# Patient Record
Sex: Male | Born: 1950 | Race: White | Hispanic: No | Smoking: Never smoker
Health system: Southern US, Community
[De-identification: ages and names within clinical notes are randomized; demographics above are authoritative.]

## PROBLEM LIST (undated history)

## (undated) DIAGNOSIS — F32A Depression, unspecified: Secondary | ICD-10-CM

## (undated) DIAGNOSIS — J449 Chronic obstructive pulmonary disease, unspecified: Secondary | ICD-10-CM

## (undated) DIAGNOSIS — M4802 Spinal stenosis, cervical region: Secondary | ICD-10-CM

## (undated) DIAGNOSIS — E7800 Pure hypercholesterolemia, unspecified: Secondary | ICD-10-CM

## (undated) DIAGNOSIS — I10 Essential (primary) hypertension: Secondary | ICD-10-CM

## (undated) DIAGNOSIS — J309 Allergic rhinitis, unspecified: Secondary | ICD-10-CM

## (undated) DIAGNOSIS — F411 Generalized anxiety disorder: Secondary | ICD-10-CM

## (undated) DIAGNOSIS — M48061 Spinal stenosis, lumbar region without neurogenic claudication: Secondary | ICD-10-CM

## (undated) HISTORY — PX: REPLACEMENT TOTAL KNEE: SUR1224

## (undated) HISTORY — PX: BACK SURGERY: SHX140

---

## 1999-12-15 ENCOUNTER — Encounter: Payer: Self-pay | Admitting: Internal Medicine

## 1999-12-15 ENCOUNTER — Encounter: Admission: RE | Admit: 1999-12-15 | Discharge: 1999-12-15 | Payer: Self-pay | Admitting: Internal Medicine

## 1999-12-16 ENCOUNTER — Encounter: Admission: RE | Admit: 1999-12-16 | Discharge: 1999-12-16 | Payer: Self-pay | Admitting: Internal Medicine

## 1999-12-16 ENCOUNTER — Encounter: Payer: Self-pay | Admitting: Internal Medicine

## 2000-10-20 ENCOUNTER — Ambulatory Visit (HOSPITAL_COMMUNITY): Admission: RE | Admit: 2000-10-20 | Discharge: 2000-10-20 | Payer: Self-pay | Admitting: Neurosurgery

## 2000-11-22 ENCOUNTER — Inpatient Hospital Stay (HOSPITAL_COMMUNITY): Admission: RE | Admit: 2000-11-22 | Discharge: 2000-11-26 | Payer: Self-pay | Admitting: Neurosurgery

## 2000-12-18 ENCOUNTER — Encounter: Admission: RE | Admit: 2000-12-18 | Discharge: 2000-12-18 | Payer: Self-pay | Admitting: Neurosurgery

## 2001-06-18 ENCOUNTER — Ambulatory Visit (HOSPITAL_COMMUNITY): Admission: RE | Admit: 2001-06-18 | Discharge: 2001-06-18 | Payer: Self-pay | Admitting: Neurosurgery

## 2001-07-24 ENCOUNTER — Observation Stay (HOSPITAL_COMMUNITY): Admission: RE | Admit: 2001-07-24 | Discharge: 2001-07-25 | Payer: Self-pay | Admitting: Neurosurgery

## 2002-12-21 ENCOUNTER — Ambulatory Visit (HOSPITAL_COMMUNITY): Admission: RE | Admit: 2002-12-21 | Discharge: 2002-12-21 | Payer: Self-pay | Admitting: Neurosurgery

## 2010-02-26 ENCOUNTER — Encounter: Payer: Self-pay | Admitting: Neurosurgery

## 2017-03-26 DIAGNOSIS — M5441 Lumbago with sciatica, right side: Secondary | ICD-10-CM | POA: Diagnosis not present

## 2017-03-26 DIAGNOSIS — M549 Dorsalgia, unspecified: Secondary | ICD-10-CM | POA: Diagnosis not present

## 2017-05-30 DIAGNOSIS — Z6827 Body mass index (BMI) 27.0-27.9, adult: Secondary | ICD-10-CM | POA: Diagnosis not present

## 2017-05-30 DIAGNOSIS — J039 Acute tonsillitis, unspecified: Secondary | ICD-10-CM | POA: Diagnosis not present

## 2017-11-06 DIAGNOSIS — Z1339 Encounter for screening examination for other mental health and behavioral disorders: Secondary | ICD-10-CM | POA: Diagnosis not present

## 2017-11-06 DIAGNOSIS — Z79899 Other long term (current) drug therapy: Secondary | ICD-10-CM | POA: Diagnosis not present

## 2017-11-06 DIAGNOSIS — Z125 Encounter for screening for malignant neoplasm of prostate: Secondary | ICD-10-CM | POA: Diagnosis not present

## 2017-11-06 DIAGNOSIS — Z6828 Body mass index (BMI) 28.0-28.9, adult: Secondary | ICD-10-CM | POA: Diagnosis not present

## 2017-11-06 DIAGNOSIS — Z0000 Encounter for general adult medical examination without abnormal findings: Secondary | ICD-10-CM | POA: Diagnosis not present

## 2017-11-06 DIAGNOSIS — E782 Mixed hyperlipidemia: Secondary | ICD-10-CM | POA: Diagnosis not present

## 2017-11-06 DIAGNOSIS — Z1331 Encounter for screening for depression: Secondary | ICD-10-CM | POA: Diagnosis not present

## 2017-11-06 DIAGNOSIS — Z23 Encounter for immunization: Secondary | ICD-10-CM | POA: Diagnosis not present

## 2017-11-06 DIAGNOSIS — K2270 Barrett's esophagus without dysplasia: Secondary | ICD-10-CM | POA: Diagnosis not present

## 2017-11-07 DIAGNOSIS — Z23 Encounter for immunization: Secondary | ICD-10-CM | POA: Diagnosis not present

## 2017-11-26 DIAGNOSIS — K22719 Barrett's esophagus with dysplasia, unspecified: Secondary | ICD-10-CM | POA: Diagnosis not present

## 2017-11-26 DIAGNOSIS — K219 Gastro-esophageal reflux disease without esophagitis: Secondary | ICD-10-CM | POA: Diagnosis not present

## 2017-11-26 DIAGNOSIS — K2270 Barrett's esophagus without dysplasia: Secondary | ICD-10-CM | POA: Diagnosis not present

## 2017-12-13 DIAGNOSIS — Z79899 Other long term (current) drug therapy: Secondary | ICD-10-CM | POA: Diagnosis not present

## 2017-12-13 DIAGNOSIS — K2270 Barrett's esophagus without dysplasia: Secondary | ICD-10-CM | POA: Diagnosis not present

## 2017-12-13 DIAGNOSIS — K22719 Barrett's esophagus with dysplasia, unspecified: Secondary | ICD-10-CM | POA: Diagnosis not present

## 2017-12-13 DIAGNOSIS — K449 Diaphragmatic hernia without obstruction or gangrene: Secondary | ICD-10-CM | POA: Diagnosis not present

## 2017-12-13 DIAGNOSIS — K210 Gastro-esophageal reflux disease with esophagitis: Secondary | ICD-10-CM | POA: Diagnosis not present

## 2017-12-13 DIAGNOSIS — K229 Disease of esophagus, unspecified: Secondary | ICD-10-CM | POA: Diagnosis not present

## 2017-12-13 DIAGNOSIS — Z7982 Long term (current) use of aspirin: Secondary | ICD-10-CM | POA: Diagnosis not present

## 2017-12-13 DIAGNOSIS — Z8719 Personal history of other diseases of the digestive system: Secondary | ICD-10-CM | POA: Diagnosis not present

## 2017-12-13 DIAGNOSIS — K219 Gastro-esophageal reflux disease without esophagitis: Secondary | ICD-10-CM | POA: Diagnosis not present

## 2018-06-19 DIAGNOSIS — D509 Iron deficiency anemia, unspecified: Secondary | ICD-10-CM | POA: Diagnosis not present

## 2018-06-19 DIAGNOSIS — I10 Essential (primary) hypertension: Secondary | ICD-10-CM | POA: Diagnosis not present

## 2018-06-19 DIAGNOSIS — K219 Gastro-esophageal reflux disease without esophagitis: Secondary | ICD-10-CM | POA: Diagnosis not present

## 2018-06-19 DIAGNOSIS — E663 Overweight: Secondary | ICD-10-CM | POA: Diagnosis not present

## 2018-06-19 DIAGNOSIS — K2270 Barrett's esophagus without dysplasia: Secondary | ICD-10-CM | POA: Diagnosis not present

## 2018-06-19 DIAGNOSIS — Z6829 Body mass index (BMI) 29.0-29.9, adult: Secondary | ICD-10-CM | POA: Diagnosis not present

## 2018-06-19 DIAGNOSIS — E782 Mixed hyperlipidemia: Secondary | ICD-10-CM | POA: Diagnosis not present

## 2018-09-05 DIAGNOSIS — R0602 Shortness of breath: Secondary | ICD-10-CM | POA: Diagnosis not present

## 2018-09-05 DIAGNOSIS — I87301 Chronic venous hypertension (idiopathic) without complications of right lower extremity: Secondary | ICD-10-CM | POA: Diagnosis not present

## 2018-09-05 DIAGNOSIS — R600 Localized edema: Secondary | ICD-10-CM | POA: Diagnosis not present

## 2018-09-10 DIAGNOSIS — T7840XA Allergy, unspecified, initial encounter: Secondary | ICD-10-CM | POA: Diagnosis not present

## 2018-09-10 DIAGNOSIS — I87301 Chronic venous hypertension (idiopathic) without complications of right lower extremity: Secondary | ICD-10-CM | POA: Diagnosis not present

## 2018-09-10 DIAGNOSIS — L03115 Cellulitis of right lower limb: Secondary | ICD-10-CM | POA: Diagnosis not present

## 2018-09-10 DIAGNOSIS — Z6829 Body mass index (BMI) 29.0-29.9, adult: Secondary | ICD-10-CM | POA: Diagnosis not present

## 2018-09-17 DIAGNOSIS — Z6829 Body mass index (BMI) 29.0-29.9, adult: Secondary | ICD-10-CM | POA: Diagnosis not present

## 2018-09-17 DIAGNOSIS — L03115 Cellulitis of right lower limb: Secondary | ICD-10-CM | POA: Diagnosis not present

## 2018-09-17 DIAGNOSIS — I878 Other specified disorders of veins: Secondary | ICD-10-CM | POA: Diagnosis not present

## 2018-11-11 DIAGNOSIS — Z23 Encounter for immunization: Secondary | ICD-10-CM | POA: Diagnosis not present

## 2018-12-23 DIAGNOSIS — K22719 Barrett's esophagus with dysplasia, unspecified: Secondary | ICD-10-CM | POA: Diagnosis not present

## 2018-12-23 DIAGNOSIS — K219 Gastro-esophageal reflux disease without esophagitis: Secondary | ICD-10-CM | POA: Diagnosis not present

## 2019-01-08 DIAGNOSIS — Z0000 Encounter for general adult medical examination without abnormal findings: Secondary | ICD-10-CM | POA: Diagnosis not present

## 2019-01-08 DIAGNOSIS — I10 Essential (primary) hypertension: Secondary | ICD-10-CM | POA: Diagnosis not present

## 2019-01-08 DIAGNOSIS — E782 Mixed hyperlipidemia: Secondary | ICD-10-CM | POA: Diagnosis not present

## 2019-01-08 DIAGNOSIS — Z79899 Other long term (current) drug therapy: Secondary | ICD-10-CM | POA: Diagnosis not present

## 2019-01-08 DIAGNOSIS — Z125 Encounter for screening for malignant neoplasm of prostate: Secondary | ICD-10-CM | POA: Diagnosis not present

## 2019-01-08 DIAGNOSIS — Z23 Encounter for immunization: Secondary | ICD-10-CM | POA: Diagnosis not present

## 2019-01-08 DIAGNOSIS — R739 Hyperglycemia, unspecified: Secondary | ICD-10-CM | POA: Diagnosis not present

## 2019-01-08 DIAGNOSIS — K219 Gastro-esophageal reflux disease without esophagitis: Secondary | ICD-10-CM | POA: Diagnosis not present

## 2019-01-08 DIAGNOSIS — K2270 Barrett's esophagus without dysplasia: Secondary | ICD-10-CM | POA: Diagnosis not present

## 2019-07-22 DIAGNOSIS — Z9181 History of falling: Secondary | ICD-10-CM | POA: Diagnosis not present

## 2019-07-22 DIAGNOSIS — Z6830 Body mass index (BMI) 30.0-30.9, adult: Secondary | ICD-10-CM | POA: Diagnosis not present

## 2019-07-22 DIAGNOSIS — E782 Mixed hyperlipidemia: Secondary | ICD-10-CM | POA: Diagnosis not present

## 2019-07-22 DIAGNOSIS — J449 Chronic obstructive pulmonary disease, unspecified: Secondary | ICD-10-CM | POA: Diagnosis not present

## 2019-07-22 DIAGNOSIS — K219 Gastro-esophageal reflux disease without esophagitis: Secondary | ICD-10-CM | POA: Diagnosis not present

## 2019-07-22 DIAGNOSIS — E669 Obesity, unspecified: Secondary | ICD-10-CM | POA: Diagnosis not present

## 2019-07-22 DIAGNOSIS — I10 Essential (primary) hypertension: Secondary | ICD-10-CM | POA: Diagnosis not present

## 2019-07-22 DIAGNOSIS — K2270 Barrett's esophagus without dysplasia: Secondary | ICD-10-CM | POA: Diagnosis not present

## 2019-07-22 DIAGNOSIS — Z1331 Encounter for screening for depression: Secondary | ICD-10-CM | POA: Diagnosis not present

## 2019-11-25 DIAGNOSIS — Z23 Encounter for immunization: Secondary | ICD-10-CM | POA: Diagnosis not present

## 2019-12-22 DIAGNOSIS — K2270 Barrett's esophagus without dysplasia: Secondary | ICD-10-CM | POA: Diagnosis not present

## 2019-12-22 DIAGNOSIS — K219 Gastro-esophageal reflux disease without esophagitis: Secondary | ICD-10-CM | POA: Diagnosis not present

## 2020-01-13 DIAGNOSIS — R918 Other nonspecific abnormal finding of lung field: Secondary | ICD-10-CM | POA: Diagnosis not present

## 2020-01-13 DIAGNOSIS — J418 Mixed simple and mucopurulent chronic bronchitis: Secondary | ICD-10-CM | POA: Diagnosis not present

## 2020-01-13 DIAGNOSIS — E782 Mixed hyperlipidemia: Secondary | ICD-10-CM | POA: Diagnosis not present

## 2020-01-13 DIAGNOSIS — Z122 Encounter for screening for malignant neoplasm of respiratory organs: Secondary | ICD-10-CM | POA: Diagnosis not present

## 2020-01-13 DIAGNOSIS — Z79899 Other long term (current) drug therapy: Secondary | ICD-10-CM | POA: Diagnosis not present

## 2020-01-13 DIAGNOSIS — Z0000 Encounter for general adult medical examination without abnormal findings: Secondary | ICD-10-CM | POA: Diagnosis not present

## 2020-01-13 DIAGNOSIS — R7302 Impaired glucose tolerance (oral): Secondary | ICD-10-CM | POA: Diagnosis not present

## 2020-01-13 DIAGNOSIS — Z125 Encounter for screening for malignant neoplasm of prostate: Secondary | ICD-10-CM | POA: Diagnosis not present

## 2020-01-13 DIAGNOSIS — J3089 Other allergic rhinitis: Secondary | ICD-10-CM | POA: Diagnosis not present

## 2020-01-19 DIAGNOSIS — R918 Other nonspecific abnormal finding of lung field: Secondary | ICD-10-CM | POA: Diagnosis not present

## 2020-01-19 DIAGNOSIS — R911 Solitary pulmonary nodule: Secondary | ICD-10-CM | POA: Diagnosis not present

## 2020-01-19 DIAGNOSIS — J984 Other disorders of lung: Secondary | ICD-10-CM | POA: Diagnosis not present

## 2020-01-19 DIAGNOSIS — R062 Wheezing: Secondary | ICD-10-CM | POA: Diagnosis not present

## 2020-01-19 DIAGNOSIS — I700 Atherosclerosis of aorta: Secondary | ICD-10-CM | POA: Diagnosis not present

## 2020-09-08 DIAGNOSIS — K2100 Gastro-esophageal reflux disease with esophagitis, without bleeding: Secondary | ICD-10-CM | POA: Diagnosis not present

## 2020-09-08 DIAGNOSIS — K2270 Barrett's esophagus without dysplasia: Secondary | ICD-10-CM | POA: Diagnosis not present

## 2020-09-08 DIAGNOSIS — E782 Mixed hyperlipidemia: Secondary | ICD-10-CM | POA: Diagnosis not present

## 2020-09-08 DIAGNOSIS — E669 Obesity, unspecified: Secondary | ICD-10-CM | POA: Diagnosis not present

## 2020-09-08 DIAGNOSIS — R918 Other nonspecific abnormal finding of lung field: Secondary | ICD-10-CM | POA: Diagnosis not present

## 2020-09-08 DIAGNOSIS — I10 Essential (primary) hypertension: Secondary | ICD-10-CM | POA: Diagnosis not present

## 2020-09-08 DIAGNOSIS — Z6830 Body mass index (BMI) 30.0-30.9, adult: Secondary | ICD-10-CM | POA: Diagnosis not present

## 2020-09-08 DIAGNOSIS — I700 Atherosclerosis of aorta: Secondary | ICD-10-CM | POA: Diagnosis not present

## 2020-09-08 DIAGNOSIS — J418 Mixed simple and mucopurulent chronic bronchitis: Secondary | ICD-10-CM | POA: Diagnosis not present

## 2020-11-17 DIAGNOSIS — Z23 Encounter for immunization: Secondary | ICD-10-CM | POA: Diagnosis not present

## 2020-11-23 DIAGNOSIS — R69 Illness, unspecified: Secondary | ICD-10-CM | POA: Diagnosis not present

## 2020-11-30 DIAGNOSIS — M545 Low back pain, unspecified: Secondary | ICD-10-CM | POA: Diagnosis not present

## 2020-11-30 DIAGNOSIS — G8929 Other chronic pain: Secondary | ICD-10-CM | POA: Diagnosis not present

## 2020-11-30 DIAGNOSIS — Z6830 Body mass index (BMI) 30.0-30.9, adult: Secondary | ICD-10-CM | POA: Diagnosis not present

## 2021-02-10 DIAGNOSIS — K219 Gastro-esophageal reflux disease without esophagitis: Secondary | ICD-10-CM | POA: Diagnosis not present

## 2021-02-10 DIAGNOSIS — K2270 Barrett's esophagus without dysplasia: Secondary | ICD-10-CM | POA: Diagnosis not present

## 2021-02-16 DIAGNOSIS — Z79899 Other long term (current) drug therapy: Secondary | ICD-10-CM | POA: Diagnosis not present

## 2021-02-16 DIAGNOSIS — R739 Hyperglycemia, unspecified: Secondary | ICD-10-CM | POA: Diagnosis not present

## 2021-02-16 DIAGNOSIS — E782 Mixed hyperlipidemia: Secondary | ICD-10-CM | POA: Diagnosis not present

## 2021-02-16 DIAGNOSIS — Z125 Encounter for screening for malignant neoplasm of prostate: Secondary | ICD-10-CM | POA: Diagnosis not present

## 2021-02-23 DIAGNOSIS — K2270 Barrett's esophagus without dysplasia: Secondary | ICD-10-CM | POA: Diagnosis not present

## 2021-02-23 DIAGNOSIS — E782 Mixed hyperlipidemia: Secondary | ICD-10-CM | POA: Diagnosis not present

## 2021-02-23 DIAGNOSIS — I700 Atherosclerosis of aorta: Secondary | ICD-10-CM | POA: Diagnosis not present

## 2021-02-23 DIAGNOSIS — I10 Essential (primary) hypertension: Secondary | ICD-10-CM | POA: Diagnosis not present

## 2021-02-23 DIAGNOSIS — R918 Other nonspecific abnormal finding of lung field: Secondary | ICD-10-CM | POA: Diagnosis not present

## 2021-02-23 DIAGNOSIS — J418 Mixed simple and mucopurulent chronic bronchitis: Secondary | ICD-10-CM | POA: Diagnosis not present

## 2021-02-23 DIAGNOSIS — M4726 Other spondylosis with radiculopathy, lumbar region: Secondary | ICD-10-CM | POA: Diagnosis not present

## 2021-02-23 DIAGNOSIS — K2100 Gastro-esophageal reflux disease with esophagitis, without bleeding: Secondary | ICD-10-CM | POA: Diagnosis not present

## 2021-02-23 DIAGNOSIS — Z0000 Encounter for general adult medical examination without abnormal findings: Secondary | ICD-10-CM | POA: Diagnosis not present

## 2021-03-01 DIAGNOSIS — K229 Disease of esophagus, unspecified: Secondary | ICD-10-CM | POA: Diagnosis not present

## 2021-03-01 DIAGNOSIS — K2270 Barrett's esophagus without dysplasia: Secondary | ICD-10-CM | POA: Diagnosis not present

## 2021-03-01 DIAGNOSIS — K449 Diaphragmatic hernia without obstruction or gangrene: Secondary | ICD-10-CM | POA: Diagnosis not present

## 2021-03-01 DIAGNOSIS — K219 Gastro-esophageal reflux disease without esophagitis: Secondary | ICD-10-CM | POA: Diagnosis not present

## 2021-04-26 DIAGNOSIS — R531 Weakness: Secondary | ICD-10-CM | POA: Diagnosis not present

## 2021-04-26 DIAGNOSIS — M79605 Pain in left leg: Secondary | ICD-10-CM | POA: Diagnosis not present

## 2021-04-26 DIAGNOSIS — W19XXXA Unspecified fall, initial encounter: Secondary | ICD-10-CM | POA: Diagnosis not present

## 2021-04-26 DIAGNOSIS — Z043 Encounter for examination and observation following other accident: Secondary | ICD-10-CM | POA: Diagnosis not present

## 2021-04-26 DIAGNOSIS — Z87891 Personal history of nicotine dependence: Secondary | ICD-10-CM | POA: Diagnosis not present

## 2021-04-26 DIAGNOSIS — G8929 Other chronic pain: Secondary | ICD-10-CM | POA: Diagnosis not present

## 2021-04-26 DIAGNOSIS — M542 Cervicalgia: Secondary | ICD-10-CM | POA: Diagnosis not present

## 2021-04-26 DIAGNOSIS — S0990XA Unspecified injury of head, initial encounter: Secondary | ICD-10-CM | POA: Diagnosis not present

## 2021-04-26 DIAGNOSIS — I10 Essential (primary) hypertension: Secondary | ICD-10-CM | POA: Diagnosis not present

## 2021-04-26 DIAGNOSIS — S46912A Strain of unspecified muscle, fascia and tendon at shoulder and upper arm level, left arm, initial encounter: Secondary | ICD-10-CM | POA: Diagnosis not present

## 2021-04-27 DIAGNOSIS — M5459 Other low back pain: Secondary | ICD-10-CM | POA: Diagnosis not present

## 2021-04-27 DIAGNOSIS — M542 Cervicalgia: Secondary | ICD-10-CM | POA: Diagnosis not present

## 2021-04-27 DIAGNOSIS — M5412 Radiculopathy, cervical region: Secondary | ICD-10-CM | POA: Diagnosis not present

## 2021-04-27 DIAGNOSIS — M4316 Spondylolisthesis, lumbar region: Secondary | ICD-10-CM | POA: Diagnosis not present

## 2021-04-27 DIAGNOSIS — M47816 Spondylosis without myelopathy or radiculopathy, lumbar region: Secondary | ICD-10-CM | POA: Diagnosis not present

## 2021-04-27 DIAGNOSIS — M546 Pain in thoracic spine: Secondary | ICD-10-CM | POA: Diagnosis not present

## 2021-04-27 DIAGNOSIS — M5416 Radiculopathy, lumbar region: Secondary | ICD-10-CM | POA: Diagnosis not present

## 2021-04-27 DIAGNOSIS — Z4789 Encounter for other orthopedic aftercare: Secondary | ICD-10-CM | POA: Diagnosis not present

## 2021-04-27 DIAGNOSIS — M47814 Spondylosis without myelopathy or radiculopathy, thoracic region: Secondary | ICD-10-CM | POA: Diagnosis not present

## 2021-04-27 DIAGNOSIS — Z981 Arthrodesis status: Secondary | ICD-10-CM | POA: Diagnosis not present

## 2021-04-27 DIAGNOSIS — M5134 Other intervertebral disc degeneration, thoracic region: Secondary | ICD-10-CM | POA: Diagnosis not present

## 2021-04-27 DIAGNOSIS — M549 Dorsalgia, unspecified: Secondary | ICD-10-CM | POA: Diagnosis not present

## 2021-04-28 ENCOUNTER — Other Ambulatory Visit: Payer: Self-pay

## 2021-04-28 ENCOUNTER — Encounter (HOSPITAL_COMMUNITY): Payer: Self-pay

## 2021-04-28 ENCOUNTER — Emergency Department (HOSPITAL_COMMUNITY): Payer: Medicare HMO

## 2021-04-28 ENCOUNTER — Inpatient Hospital Stay (HOSPITAL_COMMUNITY): Payer: Medicare HMO

## 2021-04-28 ENCOUNTER — Inpatient Hospital Stay (HOSPITAL_COMMUNITY)
Admission: EM | Admit: 2021-04-28 | Discharge: 2021-05-02 | DRG: 029 | Disposition: A | Discharge status: Home Health | Payer: Medicare HMO | Attending: Internal Medicine | Admitting: Internal Medicine

## 2021-04-28 DIAGNOSIS — Z888 Allergy status to other drugs, medicaments and biological substances status: Secondary | ICD-10-CM

## 2021-04-28 DIAGNOSIS — I129 Hypertensive chronic kidney disease with stage 1 through stage 4 chronic kidney disease, or unspecified chronic kidney disease: Secondary | ICD-10-CM | POA: Diagnosis not present

## 2021-04-28 DIAGNOSIS — Y929 Unspecified place or not applicable: Secondary | ICD-10-CM | POA: Diagnosis not present

## 2021-04-28 DIAGNOSIS — I6782 Cerebral ischemia: Secondary | ICD-10-CM | POA: Diagnosis not present

## 2021-04-28 DIAGNOSIS — Z7982 Long term (current) use of aspirin: Secondary | ICD-10-CM

## 2021-04-28 DIAGNOSIS — G542 Cervical root disorders, not elsewhere classified: Principal | ICD-10-CM | POA: Diagnosis present

## 2021-04-28 DIAGNOSIS — J309 Allergic rhinitis, unspecified: Secondary | ICD-10-CM | POA: Diagnosis present

## 2021-04-28 DIAGNOSIS — Z882 Allergy status to sulfonamides status: Secondary | ICD-10-CM

## 2021-04-28 DIAGNOSIS — W19XXXA Unspecified fall, initial encounter: Secondary | ICD-10-CM | POA: Diagnosis not present

## 2021-04-28 DIAGNOSIS — M4322 Fusion of spine, cervical region: Secondary | ICD-10-CM | POA: Diagnosis not present

## 2021-04-28 DIAGNOSIS — D72829 Elevated white blood cell count, unspecified: Secondary | ICD-10-CM | POA: Diagnosis present

## 2021-04-28 DIAGNOSIS — M545 Low back pain, unspecified: Secondary | ICD-10-CM | POA: Diagnosis not present

## 2021-04-28 DIAGNOSIS — W010XXA Fall on same level from slipping, tripping and stumbling without subsequent striking against object, initial encounter: Secondary | ICD-10-CM | POA: Diagnosis present

## 2021-04-28 DIAGNOSIS — N179 Acute kidney failure, unspecified: Secondary | ICD-10-CM | POA: Diagnosis present

## 2021-04-28 DIAGNOSIS — I10 Essential (primary) hypertension: Secondary | ICD-10-CM | POA: Diagnosis present

## 2021-04-28 DIAGNOSIS — T380X5A Adverse effect of glucocorticoids and synthetic analogues, initial encounter: Secondary | ICD-10-CM | POA: Diagnosis present

## 2021-04-28 DIAGNOSIS — G992 Myelopathy in diseases classified elsewhere: Secondary | ICD-10-CM | POA: Diagnosis present

## 2021-04-28 DIAGNOSIS — Z96651 Presence of right artificial knee joint: Secondary | ICD-10-CM | POA: Diagnosis present

## 2021-04-28 DIAGNOSIS — N1832 Chronic kidney disease, stage 3b: Secondary | ICD-10-CM | POA: Diagnosis not present

## 2021-04-28 DIAGNOSIS — R531 Weakness: Secondary | ICD-10-CM

## 2021-04-28 DIAGNOSIS — Z880 Allergy status to penicillin: Secondary | ICD-10-CM

## 2021-04-28 DIAGNOSIS — Y92007 Garden or yard of unspecified non-institutional (private) residence as the place of occurrence of the external cause: Secondary | ICD-10-CM | POA: Diagnosis not present

## 2021-04-28 DIAGNOSIS — G319 Degenerative disease of nervous system, unspecified: Secondary | ICD-10-CM | POA: Diagnosis not present

## 2021-04-28 DIAGNOSIS — G959 Disease of spinal cord, unspecified: Secondary | ICD-10-CM | POA: Diagnosis not present

## 2021-04-28 DIAGNOSIS — G8929 Other chronic pain: Secondary | ICD-10-CM | POA: Diagnosis present

## 2021-04-28 DIAGNOSIS — F411 Generalized anxiety disorder: Secondary | ICD-10-CM | POA: Diagnosis present

## 2021-04-28 DIAGNOSIS — E041 Nontoxic single thyroid nodule: Secondary | ICD-10-CM | POA: Diagnosis not present

## 2021-04-28 DIAGNOSIS — S199XXA Unspecified injury of neck, initial encounter: Secondary | ICD-10-CM | POA: Diagnosis not present

## 2021-04-28 DIAGNOSIS — M5001 Cervical disc disorder with myelopathy,  high cervical region: Secondary | ICD-10-CM | POA: Diagnosis not present

## 2021-04-28 DIAGNOSIS — R29818 Other symptoms and signs involving the nervous system: Secondary | ICD-10-CM | POA: Diagnosis not present

## 2021-04-28 DIAGNOSIS — J329 Chronic sinusitis, unspecified: Secondary | ICD-10-CM | POA: Diagnosis not present

## 2021-04-28 DIAGNOSIS — Z79899 Other long term (current) drug therapy: Secondary | ICD-10-CM

## 2021-04-28 DIAGNOSIS — Z7952 Long term (current) use of systemic steroids: Secondary | ICD-10-CM

## 2021-04-28 DIAGNOSIS — S0990XA Unspecified injury of head, initial encounter: Secondary | ICD-10-CM | POA: Diagnosis not present

## 2021-04-28 DIAGNOSIS — R7989 Other specified abnormal findings of blood chemistry: Secondary | ICD-10-CM | POA: Diagnosis present

## 2021-04-28 DIAGNOSIS — M549 Dorsalgia, unspecified: Secondary | ICD-10-CM | POA: Diagnosis not present

## 2021-04-28 DIAGNOSIS — Z981 Arthrodesis status: Secondary | ICD-10-CM | POA: Diagnosis not present

## 2021-04-28 DIAGNOSIS — N281 Cyst of kidney, acquired: Secondary | ICD-10-CM | POA: Diagnosis not present

## 2021-04-28 DIAGNOSIS — K219 Gastro-esophageal reflux disease without esophagitis: Secondary | ICD-10-CM | POA: Diagnosis not present

## 2021-04-28 DIAGNOSIS — M4802 Spinal stenosis, cervical region: Secondary | ICD-10-CM | POA: Diagnosis not present

## 2021-04-28 DIAGNOSIS — G9520 Unspecified cord compression: Secondary | ICD-10-CM | POA: Diagnosis not present

## 2021-04-28 DIAGNOSIS — E7800 Pure hypercholesterolemia, unspecified: Secondary | ICD-10-CM | POA: Diagnosis present

## 2021-04-28 DIAGNOSIS — M47812 Spondylosis without myelopathy or radiculopathy, cervical region: Secondary | ICD-10-CM | POA: Diagnosis not present

## 2021-04-28 DIAGNOSIS — I459 Conduction disorder, unspecified: Secondary | ICD-10-CM | POA: Diagnosis present

## 2021-04-28 DIAGNOSIS — N189 Chronic kidney disease, unspecified: Secondary | ICD-10-CM | POA: Diagnosis not present

## 2021-04-28 DIAGNOSIS — E871 Hypo-osmolality and hyponatremia: Secondary | ICD-10-CM | POA: Diagnosis present

## 2021-04-28 HISTORY — DX: Essential (primary) hypertension: I10

## 2021-04-28 HISTORY — DX: Spinal stenosis, lumbar region without neurogenic claudication: M48.061

## 2021-04-28 HISTORY — DX: Spinal stenosis, cervical region: M48.02

## 2021-04-28 HISTORY — DX: Depression, unspecified: F32.A

## 2021-04-28 HISTORY — DX: Generalized anxiety disorder: F41.1

## 2021-04-28 HISTORY — DX: Allergic rhinitis, unspecified: J30.9

## 2021-04-28 HISTORY — DX: Chronic obstructive pulmonary disease, unspecified: J44.9

## 2021-04-28 HISTORY — DX: Pure hypercholesterolemia, unspecified: E78.00

## 2021-04-28 LAB — URINALYSIS, COMPLETE (UACMP) WITH MICROSCOPIC
Bacteria, UA: NONE SEEN
Bilirubin Urine: NEGATIVE
Glucose, UA: NEGATIVE mg/dL
Hgb urine dipstick: NEGATIVE
Ketones, ur: NEGATIVE mg/dL
Leukocytes,Ua: NEGATIVE
Nitrite: NEGATIVE
Protein, ur: NEGATIVE mg/dL
Specific Gravity, Urine: 1.011 (ref 1.005–1.030)
pH: 5 (ref 5.0–8.0)

## 2021-04-28 LAB — COMPREHENSIVE METABOLIC PANEL
ALT: 30 U/L (ref 0–44)
AST: 40 U/L (ref 15–41)
Albumin: 3.9 g/dL (ref 3.5–5.0)
Alkaline Phosphatase: 42 U/L (ref 38–126)
Anion gap: 13 (ref 5–15)
BUN: 23 mg/dL (ref 8–23)
CO2: 18 mmol/L — ABNORMAL LOW (ref 22–32)
Calcium: 9.5 mg/dL (ref 8.9–10.3)
Chloride: 94 mmol/L — ABNORMAL LOW (ref 98–111)
Creatinine, Ser: 1.35 mg/dL — ABNORMAL HIGH (ref 0.61–1.24)
GFR, Estimated: 56 mL/min — ABNORMAL LOW (ref >60)
Glucose, Bld: 138 mg/dL — ABNORMAL HIGH (ref 70–99)
Potassium: 4.5 mmol/L (ref 3.5–5.1)
Sodium: 125 mmol/L — ABNORMAL LOW (ref 135–145)
Total Bilirubin: 0.8 mg/dL (ref 0.3–1.2)
Total Protein: 6.3 g/dL — ABNORMAL LOW (ref 6.5–8.1)

## 2021-04-28 LAB — CBC
HCT: 42.3 % (ref 39.0–52.0)
Hemoglobin: 14.4 g/dL (ref 13.0–17.0)
MCH: 31.1 pg (ref 26.0–34.0)
MCHC: 34 g/dL (ref 30.0–36.0)
MCV: 91.4 fL (ref 80.0–100.0)
Platelets: 220 10*3/uL (ref 150–400)
RBC: 4.63 MIL/uL (ref 4.22–5.81)
RDW: 12.8 % (ref 11.5–15.5)
WBC: 13.5 10*3/uL — ABNORMAL HIGH (ref 4.0–10.5)
nRBC: 0 % (ref 0.0–0.2)

## 2021-04-28 LAB — CREATININE, URINE, RANDOM: Creatinine, Urine: 79.21 mg/dL

## 2021-04-28 LAB — SODIUM, URINE, RANDOM: Sodium, Ur: 51 mmol/L

## 2021-04-28 LAB — OSMOLALITY, URINE: Osmolality, Ur: 362 mOsm/kg (ref 300–900)

## 2021-04-28 MED ORDER — SODIUM CHLORIDE 0.9 % IV BOLUS: 1000.0000 mL | Freq: Once | INTRAVENOUS | Status: DC

## 2021-04-28 MED ORDER — SODIUM CHLORIDE 0.9 % IV BOLUS
1000.0000 mL | Freq: Once | INTRAVENOUS | Status: AC
  Administered 2021-04-28: 1000 mL via INTRAVENOUS

## 2021-04-28 MED ORDER — ACETAMINOPHEN 650 MG RE SUPP: 650.0000 mg | Freq: Four times a day (QID) | RECTAL | Status: DC | PRN

## 2021-04-28 MED ORDER — LACTATED RINGERS IV SOLN
INTRAVENOUS | Status: AC
  Administered 2021-04-28: 50 mL/h via INTRAVENOUS

## 2021-04-28 MED ORDER — PANTOPRAZOLE SODIUM 40 MG PO TBEC
40.0000 mg | DELAYED_RELEASE_TABLET | Freq: Every day | ORAL | Status: DC
Start: 2021-04-29 — End: 2021-05-02
  Administered 2021-05-01 – 2021-05-02 (×2): 40 mg via ORAL
  Filled 2021-04-28 (×2): qty 1

## 2021-04-28 MED ORDER — ASPIRIN EC 325 MG PO TBEC
325.0000 mg | DELAYED_RELEASE_TABLET | Freq: Every day | ORAL | Status: DC
  Administered 2021-05-01 – 2021-05-02 (×2): 325 mg via ORAL
  Filled 2021-04-28 (×2): qty 1

## 2021-04-28 MED ORDER — SIMVASTATIN 20 MG PO TABS
20.0000 mg | ORAL_TABLET | Freq: Every evening | ORAL | Status: DC
  Administered 2021-04-30 – 2021-05-01 (×2): 20 mg via ORAL
  Filled 2021-04-28 (×2): qty 1

## 2021-04-28 MED ORDER — ACETAMINOPHEN 325 MG PO TABS: 650.0000 mg | ORAL_TABLET | Freq: Four times a day (QID) | ORAL | Status: DC | PRN

## 2021-04-28 MED ORDER — BISOPROLOL FUMARATE 10 MG PO TABS
10.0000 mg | ORAL_TABLET | Freq: Every day | ORAL | Status: DC
Start: 2021-04-29 — End: 2021-05-02
  Administered 2021-05-01 – 2021-05-02 (×2): 10 mg via ORAL
  Filled 2021-04-28 (×4): qty 1

## 2021-04-28 MED ORDER — HYDROCODONE-ACETAMINOPHEN 5-325 MG PO TABS
1.0000 | ORAL_TABLET | Freq: Four times a day (QID) | ORAL | Status: DC | PRN
  Administered 2021-04-30 – 2021-05-01 (×4): 1 via ORAL
  Filled 2021-04-28 (×4): qty 1

## 2021-04-28 NOTE — Assessment & Plan Note (Signed)
#)   Generalized weakness: Approximately 10-day duration of generalized weakness.  Subjectively, the patient also reports that his breathing is slightly more pronounced in the left upper and lower extremities relative to the right, although without overt to objective findings on exam to collaborate this.  CT head as well as MRI brain showed no evidence of acute intracranial process, including evidence of intracranial hemorrhage or acute ischemic infarct.  Neurology consulted and conveys that cervical cord compression is less likely, but also recommend pursuit of MRI cervical spine to further evaluate.  This imaging has been ordered, with result pending.  Preceding CT cervical spine results discussed above, in the context of a known history of cervical spinal stenosis status post cervical fusion of C4-C5 as well as C5-C6, as further detailed above. ?  ?In terms of additional contributing factors leading to presenting generalized weakness, suspect element of dehydration, leading to presumably acute hyponatremia.  No evidence of underlying infectious process at this time, although will pursue urinalysis as well as chest x-ray to further evaluate. In setting of recent fall associated with generalized weakness, will also check CPK level. ?  ?  ?  ?Plan: work-up and management of presenting hyponatremia, as described above. PT/OT consults ordered for the AM. Fall precautions. Check TSH, MMA. CMP/CBC in the AM.  Check CPK level, as above.  Check urinalysis and chest x-ray. ?  ?

## 2021-04-28 NOTE — Assessment & Plan Note (Signed)
 #)   Hyperlipidemia: documented h/o such. On simvastatin as outpatient.    Plan: continue home statin.    

## 2021-04-28 NOTE — Assessment & Plan Note (Signed)
#)  Leukocytosis: Presenting CBC reflects mildly elevated white cell count of 13,500. Suspect an element of hemoconcentration in the setting of evidence of dehydration, including via the patient's report of recent decline in oral intake. This may also be reactive in nature in the setting of report of recent ground-level mechanical fall, as further detailed above.. No evidence to suggest underlying infectious process at this time, but with plan to pursue urinalysis and chest x-ray to further evaluate for underlying evidence of such.  No additional SIRS criteria met at this time. Overall, criteria for sepsis are not currently met. Appears hemodynamically stable.  Therefore, will refrain from initiation of antibiotics at this time. ?  ?  ?Plan: Repeat CBC with diff in the morning.  Monitor strict I's and O's, daily weights.  Check urinalysis, chest x-ray.  Gentle IV fluids, as above.  Repeat CMP in the morning.  Request records from the [place], including to evaluate most recent prior lipid cell count. ?  ?  ?  ?  ?

## 2021-04-28 NOTE — H&P (Signed)
?History and Physical  ? ? ?PLEASE NOTE THAT [employer]AG[employer] SOFTWARE WAS USED IN THE CONSTRUCTION OF THIS NOTE. ? ? ?Gabriel Stein [id number]:[id number] DOB: 08/25/1950 DOA: 04/28/2021 ? ?PCP: Pcp, No (goes to the [place]) ?Patient coming from: home  ? ?I have personally briefly reviewed patient's old medical records in [facility] ? ?Chief Complaint: Weakness ? ?HPI: Gabriel Stein is a 71 y.o. male with medical history significant for hypertension, hyperlipidemia, spinal stenosis of the cervical spine status post cervical fusion C4-C5 as well as C5-C6, spinal stenosis of the lumbar spine status post L4-L5 fusion, who is admitted to [facility] on 04/28/2021 with acute hyponatremia after presenting from home to [facility] ED complaining of weakness.  ? ?The patient reports approximately 10 days of generalized weakness, resulting in a ground-level mechanical fall 1 week ago, in which the patient tripped ambulating at home.  He does not believe that he hit his head as component of this fall, and notes that the sequence was not associate with any loss of consciousness.  While he continues to note generalized weakness, he also reports that his weakness seems to be more pronounced on the left side, involving left upper and lower extremities relative to the right.  He first noticed this potential asymmetric weakness superimposed on generalized weakness following the aforementioned ground-level mechanical fall 1 week ago. Not associated with any acute focal numbness, paresthesias, facial droop, slurred speech, expressive aphasia, acute change in vision, dysphagia, vertigo. ? ?After this, mechanical fall, he also notes mild thoracic back discomfort as well as mild lumbar back pain, prompting him to present to urgent care yesterday, at which time plain films of the lumbar spine demonstrated evidence of L4-L5 vertebral body fusion, without any detected hardware complication, and no evidence of acute compression fracture.  These  plain films also showed trace anterior listhesis of L3 on L4, as well as degenerative disc changes at L3-L4 and L5-S1, with mild degenerative changes of the facet joints.  Plain films of the thoracic spine performed at that time showed mild thoracic spondylosis, without evidence of compression fracture.  ? ?In the setting of perpetuation of his generalized weakness, the patient presents to [facility] emergency department this evening for further evaluation and management thereof.  ? ?Denies any recent subjective fever, chills, rigors, or generalized myalgias. No recent headache, neck stiffness, rhinitis, rhinorrhea, sore throat, abdominal pain, diarrhea, or rash. No known recent COVID-19 exposures. Denies dysuria, gross hematuria, or change in urinary urgency/frequency.  No recent chest pain, shortness of breath, cough, palpitations, diaphoresis .he reports recent potential decline in oral intake, but notes no associated nausea/vomiting.  Denies any additional falls, trauma, injury subsequent to the ground-level mechanical fall that occurred 1 week ago. ? ?Medical history notable for essential pretension, and hypertensive regimen notable for chlorthalidone. ? ?He receives the majority of his care at the [place], with initial chart review revealing no serum sodium or creatinine levels dating back to 2002. ? ? ? ? ?ED Course:  ?Vital signs in the ED were notable for the following: Afebrile; heart rate 63-78; blood pressure 102/57 - 114/63; respiratory rate 16-21, oxygen saturation 94 to 97% on room air. ? ?Labs were notable for the following: CMP notable for the following: Sodium 125, without prior serum sodium data points available for point comparison, chloride 94, creatinine 1.35, without prior serum creatinine two-point available for point comparison, glucose 133, and liver enzymes were noted to be within normal limits.  CBC notable for  the following: Episode count 13,500. ? ?Imaging and additional notable ED work-up:  EKG notable for sinus rhythm with heart rate 60, interventricular conduction delay, no evidence of T wave or ST changes, including no evidence of ST elevation.  Noncontrast CT of the head showed no evidence of acute intracranial process, including no evidence of intracranial bleed or acute ischemic infarct.   ? ?In evaluating patient's report left-sided weakness over the course the last week, CT cervical spine showed no evidence of acute fracture, will demonstrate previous surgical fusion at C4-C5 as well as previous surgical fusion at C5-C6, while showing spinal stenosis at C3-C4 and C6-C7 levels, will demonstrating encroachment of neural foramina from C3-T1. ? ?Additionally, MRI of the brain showed no evidence of acute process, including no evidence of acute ischemic infarct. ? ?ED discussed the patient's case with the on-call neurologist, Dr. Otelia LimesLindzen, Who felt that cervical cord compression was unlikely, recommended proceeding to have MRI of the cervical spine to further evaluate.   ? ?While in the ED, the following were administered: Normal saline x1 L bolus ? ?Subsequently, the patient was admitted for further evaluation management to suspected acute hyponatremia in the setting of presenting generalized weakness, with subjective left-sided weakness, with presenting labs also notable for mild leukocytosis.   ? ? ? ? ? ?Review of Systems: As per HPI otherwise 10 point review of systems negative.  ? ?Past Medical History:  ?Diagnosis Date  ? Allergic rhinitis   ? Cervical stenosis of spine   ? Depression   ? GAD (generalized anxiety disorder)   ? Hypercholesteremia   ? Hypertension   ? Lumbar stenosis   ? ? ?Past Surgical History:  ?Procedure Laterality Date  ? BACK SURGERY    ? REPLACEMENT TOTAL KNEE Right   ? ? ?Social History: ? reports that he has never smoked. He has never used smokeless tobacco. He reports that he does not currently use alcohol. He reports that he does not use drugs. ? ? ?Allergies   ?Allergen Reactions  ? Clarithromycin Hives, Rash and Other (See Comments)  ?  Welts, also ?  ? Penicillins Hives and Rash  ?  Welts, also ?  ? Gabapentin Other (See Comments)  ?  Drowsiness and urinary incontinence ?  ? Sulfa Antibiotics Other (See Comments)  ?  Patient said his MD told him he was allergic, but the exact reaction was not recalled  ? Venlafaxine Other (See Comments)  ?  Reaction not recalled   ? ? ?History reviewed. No pertinent family history. ? ?Family history reviewed and not pertinent  ? ? ?Prior to Admission medications   ?Medication Sig Start Date End Date Taking? Authorizing Provider  ?albuterol (VENTOLIN HFA) 108 (90 Base) MCG/ACT inhaler Inhale 2 puffs into the lungs 3 (three) times daily.   Yes [provider]  ?aspirin 325 MG EC tablet Take 325 mg by mouth in the morning.   Yes [provider]  ?bisoprolol (ZEBETA) 10 MG tablet Take 10 mg by mouth in the morning.   Yes [provider]  ?chlorthalidone (HYGROTON) 25 MG tablet Take 25 mg by mouth in the morning.   Yes [provider]  ?felodipine (PLENDIL) 5 MG 24 hr tablet Take 5 mg by mouth in the morning.   Yes [provider]  ?lisinopril (ZESTRIL) 40 MG tablet Take 40 mg by mouth in the morning.   Yes [provider]  ?omeprazole (PRILOSEC) 40 MG capsule Take 40 mg by mouth 2 (  two) times daily.   Yes [provider]  ?predniSONE (DELTASONE) 20 MG tablet Take 40 mg by mouth daily with breakfast. 04/27/21 05/02/21 Yes [provider]  ?simvastatin (ZOCOR) 20 MG tablet Take 20 mg by mouth every evening.   Yes [provider]  ?traMADol (ULTRAM) 50 MG tablet Take 50 mg by mouth every 6 (six) hours as needed (for pain).   Yes [provider]  ? ? ? ?Objective  ? ? ?Physical Exam: ?Vitals:  ? 04/28/21 1930 04/28/21 2000 04/28/21 2030 04/28/21 2100  ?BP: 106/76 120/70 127/67 (!) 137/104  ?Pulse: 60 64 66 67  ?Resp: 16 20 15 20   ?Temp:    98.4 ?F (36.9 ?C)   ?TempSrc:    Oral  ?SpO2: 95% 93% 94% 94%  ?Weight:      ?Height:      ? ? ?General: appears to be stated age; alert, oriented ?Skin: warm, dry, no rash ?Head:  AT/[place] ?Mouth:  Oral mucosa membranes appear d

## 2021-04-28 NOTE — ED Provider Notes (Signed)
?[place] ?Provider Note ? ? ?CSN: [id number] ?Arrival date & time: 04/28/21  1115 ? ?  ? ?History ? ?Chief Complaint  ?Patient presents with  ? Weakness  ? ? ?Gabriel Stein is a 71 y.o. male. ? ?Pt s/p fall three days ago, was in yard, and fell back, ?hit head. Momentary loc. Pt unsure what caused fall. Indicates went to [facility] and had head ct and was released, but feels as if left arm and left leg weak ever since. Denies any new/acute numbness/weakness today. No change in speech or vision. No headache. States chronic low back pain - no recent change. No radicular pain. No neck pain. No chest pain or sob. No pain or nv. No fever or chills.  ? ?The history is provided by the EMS personnel, medical records and the patient.  ?Weakness ?Associated symptoms: no abdominal pain, no chest pain, no cough, no fever, no headaches, no shortness of breath and no vomiting   ? ?  ? ?Home Medications ?Prior to Admission medications   ?Not on File  ?   ? ?Allergies    ?Clarithromycin, Penicillins, Gabapentin, and Venlafaxine   ? ?Review of Systems   ?Review of Systems  ?Constitutional:  Negative for chills and fever.  ?HENT:  Negative for sore throat.   ?Eyes:  Negative for visual disturbance.  ?Respiratory:  Negative for cough and shortness of breath.   ?Cardiovascular:  Negative for chest pain and leg swelling.  ?Gastrointestinal:  Negative for abdominal pain and vomiting.  ?Genitourinary:  Negative for flank pain.  ?Musculoskeletal:  Negative for neck pain.  ?Skin:  Negative for rash.  ?Neurological:  Positive for weakness. Negative for speech difficulty, numbness and headaches.  ?Hematological:  Does not bruise/bleed easily.  ?Psychiatric/Behavioral:  Negative for confusion.   ? ?Physical Exam ?Updated Vital Signs ?BP (!) 123/58   Pulse 77   Temp 98.3 ?F (36.8 ?C) (Oral)   Resp 17   Ht 1.676 m (5\' 6" )   Wt 86.2 kg   SpO2 95%   BMI 30.67 kg/m?  ?Physical Exam ?Vitals  and nursing note reviewed.  ?Constitutional:   ?   Appearance: Normal appearance. He is well-developed.  ?HENT:  ?   Head: Atraumatic.  ?   Nose: Nose normal.  ?   Mouth/Throat:  ?   Mouth: Mucous membranes are moist.  ?Eyes:  ?   General: No scleral icterus. ?   Conjunctiva/sclera: Conjunctivae normal.  ?   Pupils: Pupils are equal, round, and reactive to light.  ?Neck:  ?   Vascular: No carotid bruit.  ?   Trachea: No tracheal deviation.  ?   Comments: Mild mid cervical tenderness.  ?Cardiovascular:  ?   Rate and Rhythm: Normal rate and regular rhythm.  ?   Pulses: Normal pulses.  ?   Heart sounds: Normal heart sounds. No murmur heard. ?  No friction rub. No gallop.  ?Pulmonary:  ?   Effort: Pulmonary effort is normal. No accessory muscle usage or respiratory distress.  ?   Breath sounds: Normal breath sounds.  ?Abdominal:  ?   General: Bowel sounds are normal. There is no distension.  ?   Palpations: Abdomen is soft.  ?   Tenderness: There is no abdominal tenderness. There is no guarding.  ?Genitourinary: ?   Comments: No cva tenderness. ?Musculoskeletal:     ?   General: No swelling.  ?   Cervical back: Normal range of motion  and neck supple. No rigidity.  ?   Comments: Mild mid cervical  tenderness, otherwise, CTLS spine, non tender, aligned, no step off. ?Good rom bil extremities without pain or focal bony tenderness.   ?Skin: ?   General: Skin is warm and dry.  ?   Findings: No rash.  ?Neurological:  ?   Mental Status: He is alert.  ?   Comments: Alert, speech clear. No gross dysarthria or aphasia. No pronator drift. LUE stre 4+/5. Bil lower ext stre 5/5. Sens grossly intact.   ?Psychiatric:     ?   Mood and Affect: Mood normal.  ? ? ?ED Results / Procedures / Treatments   ?Labs ?(all labs ordered are listed, but only abnormal results are displayed) ?Results for orders placed or performed during the hospital encounter of 04/28/21  ?CBC  ?Result Value Ref Range  ? WBC 13.5 (H) 4.0 - 10.5 K/uL  ? RBC 4.63 4.22  - 5.81 MIL/uL  ? Hemoglobin 14.4 13.0 - 17.0 g/dL  ? HCT 42.3 39.0 - 52.0 %  ? MCV 91.4 80.0 - 100.0 fL  ? MCH 31.1 26.0 - 34.0 pg  ? MCHC 34.0 30.0 - 36.0 g/dL  ? RDW 12.8 11.5 - 15.5 %  ? Platelets 220 150 - 400 K/uL  ? nRBC 0.0 0.0 - 0.2 %  ?Comprehensive metabolic panel  ?Result Value Ref Range  ? Sodium 125 (L) 135 - 145 mmol/L  ? Potassium 4.5 3.5 - 5.1 mmol/L  ? Chloride 94 (L) 98 - 111 mmol/L  ? CO2 18 (L) 22 - 32 mmol/L  ? Glucose, Bld 138 (H) 70 - 99 mg/dL  ? BUN 23 8 - 23 mg/dL  ? Creatinine, Ser 1.35 (H) 0.61 - 1.24 mg/dL  ? Calcium 9.5 8.9 - 10.3 mg/dL  ? Total Protein 6.3 (L) 6.5 - 8.1 g/dL  ? Albumin 3.9 3.5 - 5.0 g/dL  ? AST 40 15 - 41 U/L  ? ALT 30 0 - 44 U/L  ? Alkaline Phosphatase 42 38 - 126 U/L  ? Total Bilirubin 0.8 0.3 - 1.2 mg/dL  ? GFR, Estimated 56 (L) >60 mL/min  ? Anion gap 13 5 - 15  ? ?No results found. ? ?EKG ?EKG Interpretation ? ?Date/Time:  Thursday April 28 2021 11:48:55 EDT ?Ventricular Rate:  70 ?PR Interval:  160 ?QRS Duration: 122 ?QT Interval:  386 ?QTC Calculation: 417 ?R Axis:   -29 ?Text Interpretation: Sinus rhythm Confirmed by Lajean Saver [phone]) on 04/28/2021 11:54:10 AM ? ?Radiology ?CT Head Wo Contrast ? ?Result Date: 04/28/2021 ?CLINICAL DATA:  Trauma, fall EXAM: CT HEAD WITHOUT CONTRAST TECHNIQUE: Contiguous axial images were obtained from the base of the skull through the vertex without intravenous contrast. RADIATION DOSE REDUCTION: This exam was performed according to the departmental dose-optimization program which includes automated exposure control, adjustment of the mA and/or kV according to patient size and/or use of iterative reconstruction technique. COMPARISON:  04/26/2021 FINDINGS: Brain: No acute intracranial findings are seen. Cortical sulci are prominent. Ventricles are not dilated. Cavum septum vergae and cavum septum pellucidum are noted. No significant interval changes are noted. Vascular: There are scattered arterial calcifications. Skull: No  fracture is seen in the calvarium. Sinuses/Orbits: There is mild mucosal thickening in the ethmoid sinus. Other: None IMPRESSION: No acute intracranial findings are seen in noncontrast CT brain. Atrophy. Mild chronic sinusitis. Electronically Signed   By: Elmer Picker M.D.   On: 04/28/2021 12:02  ? ?CT Cervical Spine Wo  Contrast ? ?Result Date: 04/28/2021 ?CLINICAL DATA:  Trauma, fall EXAM: CT CERVICAL SPINE WITHOUT CONTRAST TECHNIQUE: Multidetector CT imaging of the cervical spine was performed without intravenous contrast. Multiplanar CT image reconstructions were also generated. RADIATION DOSE REDUCTION: This exam was performed according to the departmental dose-optimization program which includes automated exposure control, adjustment of the mA and/or kV according to patient size and/or use of iterative reconstruction technique. COMPARISON:  Report for the study done on 06/18/2001. Images of previous study are not available for review due to technical difficulties. FINDINGS: Alignment: Alignment of posterior margins of vertebral bodies is unremarkable. Skull base and vertebrae: No recent fracture is seen. Marked degenerative changes are noted in the left facet joint at C1-C2 level. There is anterior surgical fusion at C4-C5 and C5-C6 levels. Soft tissues and spinal canal: Posterior bony spurs causing extrinsic pressure over the ventral margin of thecal sac at multiple levels. Spinal stenosis is seen at C3-C4 and C6-C7 levels. Disc levels: There is encroachment of neural foramina from C3-T1 levels. Upper chest: There are coarse calcifications in the left apex. There are linear densities in both apices Other: There is 2.3 cm low-density nodule in the left lobe of thyroid. IMPRESSION: No recent fracture is seen in the cervical spine. Previous surgical fusion at C4-C5 and C5-C6 levels. Spinal stenosis and encroachment of neural foramina is seen at multiple levels as described in the body of the report.  Electronically Signed   By: Elmer Picker M.D.   On: 04/28/2021 12:09   ? ?Procedures ?Procedures  ? ? ?Medications Ordered in ED ?Medications - No data to display ? ?ED Course/ Medical Decision Making/ A&P ?  ?

## 2021-04-28 NOTE — Assessment & Plan Note (Signed)
#)   Essential Hypertension: documented h/o such, with outpatient antihypertensive regimen including bisoprolol, chlorthalidone lisinopril, felodipine.  SBP's in the ED today: In the low 100s.  In the setting of initial borderline hypotension, will hold home antihypertensive medications for now, including lisinopril in the setting of potential presenting AKI, with records from the [place] currently pending ordered to establish baseline renal function. ?  ?Plan: Close monitoring of subsequent BP via routine VS. hold home antihypertensive medications for now, as above.  Further evaluation management of presenting elevated serum creatinine level, as further detailed above. ?  ?  ?  ?

## 2021-04-28 NOTE — Assessment & Plan Note (Signed)
#)   Acute hypo-osmolar  hyponatremia: Presenting serum sodium of 125, without need for hyperglycemic correction, which is presumed to be acute, but in the absence of any previous available serum sodium levels for point of comparison following chart review in order to establish level of chronicity of this laboratory abnormality.  Of note, the patient receives the majority of his outpatient care at the [place]. Will request records from the [place], including results of previous laboratory studies to better further evaluate prior serum sodium levels, including baseline level.  ?  ?As it currently stands, suspect an element of hypovolumeia, with suspected contribution from dehydration in the setting of clinical evidence of such as well as report of recent decline in oral intake.  Particular in setting of concomitant hypochloremia, there appears to be a relevant pharmacologic contribution from her home scheduled chlorthalidone. ?  ?Differential also includes the possibility of a contribution from SIADH.  Of note, the patient experienced a ground-level mechanical fall 1 week ago, but conveys that he did not hit his head as a component of this fall, which would render the possibility of cerebral salt wasting syndrome less likely.  Additionally, CT head as well as MRI brain showed no evidence of acute intracranial process clinically no evidence of intracranial hemorrhage or any evidence of acute ischemic infarct. ?  ?in general, will provide gentle IV fluids to attend to suspected contribution from dehydration, while holding home chlorthalidone and further evaluating for any additional contributing factors, including SIADH, as further detailed below. Will also check TSH. No overt additional pharmacologic contributions.  No objective acute focal neurologic deficits, as above. ?  ?  ?  ?Plan: monitor strict I's and O's and daily weights.  check UA, random urine sodium, urine osmolality.  Check serum osmolality to confirm suspected  hypoosmolar etiology.  Repeat BMP at 0100 and again in the morning. Check TSH. Gentle IVF's in the form of LR 50 cc/hr x 10 hours. Check serum uric acid level, as SIADH can be associated with hypouricemia due to hyperuricuria.  Hold home chlorthalidone.  Records requested from [place], including results of previous laboratory studies, as above.  Check chest x-ray. ?  ?  ?

## 2021-04-28 NOTE — Assessment & Plan Note (Signed)
#)   GERD: documented h/o such; on  Omeprazole as outpatient.  ?  ?Plan: continue home PPI.  ?  ?   ?

## 2021-04-28 NOTE — ED Provider Notes (Signed)
I see the patient in signout from Dr. West Bali, briefly the patient is a 71 year old male with a chief complaints of left-sided weakness after a fall.  Been going on for the past week.  The patient is being evaluated for possible stroke.  Plan for hospitalist admission and possibly MRI at a different level if negative.  MRI is negative the brain.  I discussed the case with the neurologist, Dr. Otelia Limes felt reasonable for MRI of the C-spine.  Patient noted to be hyponatremic given a bolus of IV fluids.  Will discuss with hospitalist for admission. ?  Melene Plan, DO ?04/28/21 2046 ? ?

## 2021-04-28 NOTE — ED Triage Notes (Signed)
Pt arrived via [facility] from home. Pt c/o left sided arm and leg weakness after he had a fall 3 days ago. Pt states he was in yard three days ago and everything went black and he fell. Pt denies LOC. Pt states went to [place] was tx and D/C'd. Pt states his legs gave out yesterday and he fell to knees yesterday. Pt is A&Ox4. ?

## 2021-04-28 NOTE — Assessment & Plan Note (Signed)
#)   Elevated creatinine: Presenting creatinine noted to be 1.35, prior serum creatinine data point available per chart review, including the review of records available in care everywhere.  Therefore, at this time, baseline renal function is currently unclear.  However, will request records from the [place], including results of previous labs, in order to establish baseline creatinine in the relative nature of today's presenting creatinine value. ?  ?Plan: Request records from the [place], including prior laboratory results, as further detailed above.  Add on urinalysis with microscopy.  Add on random urine sent as well as random urine creatinine.  Check CPK level.  Gentle IV fluids, as well.  Monitor strict I's and O's and daily weights.  Hold home lisinopril for now.  Hold home chlorthalidone for now. ?  ?  ?  ?

## 2021-04-29 ENCOUNTER — Inpatient Hospital Stay (HOSPITAL_COMMUNITY): Payer: Medicare HMO

## 2021-04-29 DIAGNOSIS — E871 Hypo-osmolality and hyponatremia: Secondary | ICD-10-CM | POA: Diagnosis not present

## 2021-04-29 LAB — CBC WITH DIFFERENTIAL/PLATELET
Abs Immature Granulocytes: 0.06 10*3/uL (ref 0.00–0.07)
Basophils Absolute: 0 10*3/uL (ref 0.0–0.1)
Basophils Relative: 0 %
Eosinophils Absolute: 0 10*3/uL (ref 0.0–0.5)
Eosinophils Relative: 0 %
HCT: 40.2 % (ref 39.0–52.0)
Hemoglobin: 14 g/dL (ref 13.0–17.0)
Immature Granulocytes: 1 %
Lymphocytes Relative: 20 %
Lymphs Abs: 2.2 10*3/uL (ref 0.7–4.0)
MCH: 31.3 pg (ref 26.0–34.0)
MCHC: 34.8 g/dL (ref 30.0–36.0)
MCV: 89.9 fL (ref 80.0–100.0)
Monocytes Absolute: 0.9 10*3/uL (ref 0.1–1.0)
Monocytes Relative: 8 %
Neutro Abs: 8 10*3/uL — ABNORMAL HIGH (ref 1.7–7.7)
Neutrophils Relative %: 71 %
Platelets: 206 10*3/uL (ref 150–400)
RBC: 4.47 MIL/uL (ref 4.22–5.81)
RDW: 12.8 % (ref 11.5–15.5)
WBC: 11.3 10*3/uL — ABNORMAL HIGH (ref 4.0–10.5)
nRBC: 0 % (ref 0.0–0.2)

## 2021-04-29 LAB — URIC ACID: Uric Acid, Serum: 8.6 mg/dL (ref 3.7–8.6)

## 2021-04-29 LAB — COMPREHENSIVE METABOLIC PANEL
ALT: 31 U/L (ref 0–44)
AST: 31 U/L (ref 15–41)
Albumin: 3.7 g/dL (ref 3.5–5.0)
Alkaline Phosphatase: 39 U/L (ref 38–126)
Anion gap: 8 (ref 5–15)
BUN: 22 mg/dL (ref 8–23)
CO2: 22 mmol/L (ref 22–32)
Calcium: 9.3 mg/dL (ref 8.9–10.3)
Chloride: 100 mmol/L (ref 98–111)
Creatinine, Ser: 1.28 mg/dL — ABNORMAL HIGH (ref 0.61–1.24)
GFR, Estimated: 59 mL/min — ABNORMAL LOW (ref >60)
Glucose, Bld: 97 mg/dL (ref 70–99)
Potassium: 4 mmol/L (ref 3.5–5.1)
Sodium: 130 mmol/L — ABNORMAL LOW (ref 135–145)
Total Bilirubin: 0.9 mg/dL (ref 0.3–1.2)
Total Protein: 5.9 g/dL — ABNORMAL LOW (ref 6.5–8.1)

## 2021-04-29 LAB — CK: Total CK: 345 U/L (ref 49–397)

## 2021-04-29 LAB — MAGNESIUM: Magnesium: 1.9 mg/dL (ref 1.7–2.4)

## 2021-04-29 LAB — OSMOLALITY: Osmolality: 272 mOsm/kg — ABNORMAL LOW (ref 275–295)

## 2021-04-29 LAB — TSH: TSH: 2.072 u[IU]/mL (ref 0.350–4.500)

## 2021-04-29 MED ORDER — GADOBUTROL 1 MMOL/ML IV SOLN
8.0000 mL | Freq: Once | INTRAVENOUS | Status: AC | PRN
  Administered 2021-04-29: 8 mL via INTRAVENOUS

## 2021-04-29 NOTE — Progress Notes (Signed)
PT Cancellation Note ? ?Patient Details ?Name: Gabriel Stein ?MRN: [id number] ?DOB: May 21, 1950 ? ? ?Cancelled Treatment:    Reason Eval/Treat Not Completed: Other (comment);Medical issues which prohibited therapy (On HOLD today until Neurosurgery sees pt.) ? ? ?Gabriel Stein ?04/29/2021, 9:30 AM ?Arrie Aran M,PT ?Acute Rehab Services ?[phone] ?[phone] (pager)  ?

## 2021-04-29 NOTE — Progress Notes (Signed)
?PROGRESS NOTE ? ? ? ?Gabriel Stein  [id number] DOB: Nov 15, 1950 DOA: 04/28/2021 ?PCP: Pcp, No  ? ? ? ?Brief Narrative:  ?Gabriel Stein is a 71 y.o. male with medical history significant for hypertension, hyperlipidemia, spinal stenosis of the cervical spine status post cervical fusion C4-C5 as well as C5-C6, spinal stenosis of the lumbar spine status post L4-L5 fusion, presents to [facility] ED complaining of weakness.  ? ?Subjective: ? ?Some neck pain ?Also has chronic low back pain ? ?Assessment & Plan: ? Principal Problem: ?  Acute hyponatremia ?Active Problems: ?  Generalized weakness ?  Elevated serum creatinine ?  Leukocytosis ?  Hypertension ?  Hypercholesteremia ?  GERD (gastroesophageal reflux disease) ? ? ? ?Assessment and Plan: ? 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 ?Reports chronic Low back pain, nontender midline, left paraspinal tenderness, patient does not want to have mri of low back today, he is advised to talk to neurosurgery in am ? ? ?Leukocytosis ?Reports was given steroids for low back pain recently ?Does not have sign of infection ?Wbc improved  ? ? ? Hyponatremia ? from pain? Hold chlorthalidone  ?Improved,  ?Monitor  ? ?Elevated cr ?Unclear baseline ?Monitor bmp ?Renal dosing meds ? ?HTN ? Stable, continue zebeta, hold lisinopril  ? ?HLD ?Continue statin ? ?GERD (gastroesophageal reflux disease) ? continue home PPI.  ?  ?  ? ?I have Reviewed nursing notes, Vitals, pain scores, I/o's, Lab results and  imaging results since pt's last encounter, details please see discussion above  ?I ordered the following labs:  ?Unresulted Labs (From admission, onward)  ? ?  Start     Ordered  ? 04/30/21 0500  CBC  Tomorrow morning,   R       ? 04/29/21 1954  ? 04/30/21 0500  Basic metabolic panel  Tomorrow morning,   R       ? 04/29/21 1954  ? 04/30/21 0500  Magnesium  Tomorrow morning,   R       ? 04/29/21 1954  ? 04/28/21  2023  Methylmalonic acid, serum  Add-on,   AD       ?Question:  Specimen collection method  Answer:  Lab=Lab collect  ? 04/28/21 2022  ? ?  ?  ? ?  ? ? ? ?DVT prophylaxis: SCDs Start: 04/28/21 1952 ? ? ?Code Status:   Code Status: Full Code ? ?Family Communication: patient ?Disposition:  ? ?Status is: Inpatient ? ?  ? ?Dispo: The patient is from: home ?             Anticipated d/c is to: home ?             Anticipated d/c date is: TBD ? ?Antimicrobials:   ? ?Anti-infectives (From admission, onward)  ? ? None  ? ?  ? ? ? ? ? ?Objective: ?Vitals:  ? 04/29/21 0035 04/29/21 0755 04/29/21 1223 04/29/21 1647  ?BP: (!) 97/58 112/64 (!) 147/72 113/62  ?Pulse: 98 63 74 88  ?Resp: 17 15 (!) 21 20  ?Temp: 98.4 ?F (36.9 ?C) 98.8 ?F (37.1 ?C) 97.7 ?F (36.5 ?C) 98.2 ?F (36.8 ?C)  ?TempSrc: Oral Oral Oral Oral  ?SpO2: 95% 92% 94% 93%  ?Weight:      ?Height:      ? ? ?Intake/Output Summary (Last 24 hours) at 04/29/2021 1955 ?Last data filed at 04/29/2021 1715 ?Gross per 24 hour  ?  Intake 540 ml  ?Output 200 ml  ?Net 340 ml  ? ?Filed Weights  ? 04/28/21 1123  ?Weight: 86.2 kg  ? ? ?Examination: ? ?General exam: alert, awake, communicative,calm, NAD ?Respiratory system: Clear to auscultation. Respiratory effort normal. ?Cardiovascular system:  RRR.  ?Gastrointestinal system: Abdomen is nondistended, soft and nontender.  Normal bowel sounds heard. ?Central nervous system: Alert and oriented. Mild weakness left upper and left lower extremity ?Extremities:  no edema ?Skin: No rashes, lesions or ulcers ?Psychiatry: Judgement and insight appear normal. Mood & affect appropriate.  ? ? ? ?Data Reviewed: I have personally reviewed  labs and visualized  imaging studies since the last encounter and formulate the plan  ? ? ? ? ? ? ?Scheduled Meds: ? aspirin  325 mg Oral Daily  ? bisoprolol  10 mg Oral Daily  ? pantoprazole  40 mg Oral Daily  ? simvastatin  20 mg Oral QPM  ? ?Continuous Infusions: ? ? LOS: 1 day  ? ? ? ?Albertine Grates, MD PhD FACP ?[facility] ? ?Available via Epic secure chat 7am-7pm for nonurgent issues ?Please page for urgent issues ?To page the attending provider between 7A-7P or the covering provider during after hours 7P-7A, please log into the web site [email or web address] and access using universal [place] password for that web site. If you do not have the password, please call the hospital operator. ? ? ? ?04/29/2021, 7:55 PM  ? ? ?

## 2021-04-29 NOTE — Care Management (Signed)
Dr Erlinda Hong messaged this RNCM to make this patient NPO, called floor, NT will place a sign on door, Neurosurgery to evaluate ?

## 2021-04-29 NOTE — Consult Note (Signed)
?Chief Complaint  ? ?Chief Complaint  ?Patient presents with  ? Weakness  ? ? ?History of Present Illness  ?Gabriel Stein is a 71 y.o. male presenting to the hospital with recent fall and exacerbation of more generalized slowly progressive weakness.  Briefly, patient has a history of previous anterior cervical discectomy and fusion more than 20 years ago.  He has chronic neck pain.  He does report over the last several months at least, he has had worsening generalized weakness.  He has noted progressively worsening imbalance but was able to walk without the assistance of any devices.  This past Monday he tripped and fell and noted significant worsening of his weakness more so on the left.  He does not report any changes in bladder function. ? ?Of note, the patient does report a history of medically controlled hypertension and hyperlipidemia.  He does not report any history of heart disease, previous heart attack or stroke.  He does take 325 mg of aspirin daily, he reports his last dose was 4 days ago. ? ?Past Medical History  ? ?Past Medical History:  ?Diagnosis Date  ? Allergic rhinitis   ? Cervical stenosis of spine   ? Depression   ? GAD (generalized anxiety disorder)   ? Hypercholesteremia   ? Hypertension   ? Lumbar stenosis   ? ? ?Past Surgical History  ? ?Past Surgical History:  ?Procedure Laterality Date  ? BACK SURGERY    ? REPLACEMENT TOTAL KNEE Right   ? ? ?Social History  ? ?Social History  ? ?Tobacco Use  ? Smoking status: Never  ? Smokeless tobacco: Never  ?Substance Use Topics  ? Alcohol use: Not Currently  ? Drug use: Never  ? ? ?Medications  ? ?Prior to Admission medications   ?Medication Sig Start Date End Date Taking? Authorizing Provider  ?albuterol (VENTOLIN HFA) 108 (90 Base) MCG/ACT inhaler Inhale 2 puffs into the lungs 3 (three) times daily.   Yes [provider]  ?aspirin 325 MG EC tablet Take 325 mg by mouth in the morning.   Yes [provider]  ?bisoprolol (ZEBETA)  10 MG tablet Take 10 mg by mouth in the morning.   Yes [provider]  ?chlorthalidone (HYGROTON) 25 MG tablet Take 25 mg by mouth in the morning.   Yes [provider]  ?felodipine (PLENDIL) 5 MG 24 hr tablet Take 5 mg by mouth in the morning.   Yes [provider]  ?lisinopril (ZESTRIL) 40 MG tablet Take 40 mg by mouth in the morning.   Yes [provider]  ?omeprazole (PRILOSEC) 40 MG capsule Take 40 mg by mouth 2 (two) times daily.   Yes [provider]  ?predniSONE (DELTASONE) 20 MG tablet Take 40 mg by mouth daily with breakfast. 04/27/21 05/02/21 Yes [provider]  ?simvastatin (ZOCOR) 20 MG tablet Take 20 mg by mouth every evening.   Yes [provider]  ?traMADol (ULTRAM) 50 MG tablet Take 50 mg by mouth every 6 (six) hours as needed (for pain).   Yes [provider]  ? ? ?Allergies  ? ?Allergies  ?Allergen Reactions  ? Clarithromycin Hives, Rash and Other (See Comments)  ?  Welts, also ?  ? Penicillins Hives and Rash  ?  Welts, also ?  ? Gabapentin Other (See Comments)  ?  Drowsiness and urinary incontinence ?  ? Sulfa Antibiotics Other (See Comments)  ?  Patient said his MD told him he was allergic, but  the exact reaction was not recalled  ? Venlafaxine Other (See Comments)  ?  Reaction not recalled   ? ? ?Review of Systems  ?ROS ? ?Neurologic Exam  ?Awake, alert, oriented ?Memory and concentration grossly intact ?Speech fluent, appropriate ?CN grossly intact ?Motor exam: ?Upper Extremities Deltoid Bicep Tricep Grip  ?Right 5/5 5/5 5/5 5/5  ?Left 4+/5 4/5 4+/5 4/5  ? ?Lower Extremities IP Quad PF DF EHL  ?Right 5/5 5/5 5/5 5/5 5/5  ?Left 4/5 4/5 4+/5 4+/5 4+/5  ? ?Sensation grossly intact to LT ? ?Imaging  ?MRI of the cervical spine dated 04/28/2021 was personally reviewed.  This demonstrates susceptibility artifact from previous C4-5 C5-6 ACDF.  There appears to be good bony fusion.  There is complete collapse of the C3-4 disc space  with large slightly right eccentric disc osteophyte with associated severe stenosis and compression of the spinal cord at this level.  There is intrinsic T2 signal change within the spinal cord at the C3-4 level. ? ?Impression  ?- 71 y.o. 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  Patient has previous history of C4-5 C5-6 ACDF with good fusion 2 decades ago. ? ?Plan  ?-We will plan on proceeding with anterior cervical discectomy and fusion for decompression at C3-4.  This will likely require removal of the previous hardware. Surgery scheduled tomorrow at 9a. ?- NPO after midnight. Pt can have diet today ? ?I have reviewed the imaging findings with the patient.  Given his symptoms and the degree of spinal cord compression I did recommend surgical decompression.  We reviewed the details of the operation as well as the expected postoperative course and recovery.  I did review with him the risks of ACDF including nerve or spinal cord injury leading to weakness, paralysis, bladder dysfunction.  We also discussed risks of bleeding, infection, and fluid leak as well as general risks of anesthesia including heart attack, stroke, and blood clots.  Patient appeared to understand our discussion and does wish to proceed with surgery.  All his questions today were answered. ? ? ?Lisbeth Renshaw, MD ?[facility]  ? ?

## 2021-04-29 NOTE — Evaluation (Signed)
Occupational Therapy Evaluation ?Patient Details ?Name: Gabriel MeuseMichael L Stein ?MRN: [id number] ?DOB: 1950/11/05 ?Today's Date: 04/29/2021 ? ? ?History of Present Illness : Gabriel MeuseMichael L Stein is a 71 y.o. male with medical history significant for hypertension, hyperlipidemia, spinal stenosis of the cervical spine status post cervical fusion C4-C5 as well as C5-C6, spinal stenosis of the lumbar spine status post L4-L5 fusion, who is [facility] with acute hyponatremia after [facility] complaining of weakness.  ? ?Clinical Impression ?  ?Pt seen for OT eval.  Presents with slight weakness in the LUE compared to baseline.  Overall 3+/5 throughout but slightly stronger in biceps and triceps.  He is able to complete all bathing and dressing tasks at min guard assist sit to stand with toilet transfers at min guard using the RW for support.  Will benefit from acute care OT to address new weakness and increase ADL independence to modified independent level for home.  Will continue to follow and await neurosurgery input to see if surgery is recommended.  This could change discharge recommendations.    ?   ? ?Recommendations for follow up therapy are one component of a multi-disciplinary discharge planning process, led by the attending physician.  Recommendations may be updated based on patient status, additional functional criteria and insurance authorization.  ? ?Follow Up Recommendations ? Home health OT  ?  ?Assistance Recommended at Discharge PRN  ?Patient can return home with the following Other (comment) (currently could need intermittent assist with home management and some meal prep.  Depending on progress.) ? ?  ?Functional Status Assessment ? Patient has had a recent decline in their functional status and demonstrates the ability to make significant improvements in function in a reasonable and predictable amount of time.  ?Equipment Recommendations ? Other (comment) (RW)  ?   ?   ?Precautions / Restrictions Precautions ?Precautions: Fall ?Restrictions ?Weight Bearing Restrictions: No  ? ?  ? ?Mobility Bed Mobility ?Overal bed mobility: Modified Independent ?  ?  ?  ?  ?  ?  ?  ?  ? ?Transfers ?Overall transfer level: Needs assistance ?Equipment used: Rolling walker (2 wheels) ?Transfers: Sit to/from Stand, Bed to chair/wheelchair/BSC ?Sit to Stand: Min guard ?Stand pivot transfers: Min guard ?  ?Step pivot transfers: Min guard ?  ?  ?General transfer comment: Sit to stand without use of the RW while bathing at EOB.  Min guard for transfers with use of the RW for support to the bathroom and back. ?  ? ?  ?Balance Overall balance assessment: Needs assistance ?Sitting-balance support: No upper extremity supported ?Sitting balance-Leahy Scale: Good ?  ?  ?Standing balance support: During functional activity ?Standing balance-Leahy Scale: Fair ?  ?  ?  ?  ?  ?  ?  ?  ?  ?  ?  ?  ?   ? ?ADL either performed or assessed with clinical judgement  ? ?ADL Overall ADL's : Needs assistance/impaired ?Eating/Feeding: Independent;Sitting ?  ?Grooming: Wash/dry hands;Wash/dry face;Set up;Sitting ?  ?Upper Body Bathing: Set up;Sitting ?  ?Lower Body Bathing: Min guard;Sit to/from stand ?  ?Upper Body Dressing : Minimal assistance;Sitting ?Upper Body Dressing Details (indicate cue type and reason): hospital gown ?Lower Body Dressing: Min guard;Sit to/from stand ?  ?Toilet Transfer: Min guard;Rolling walker (2 wheels) ?  ?Toileting- ArchitectClothing Manipulation and Hygiene: Min guard;Sit to/from stand ?  ?  ?  ?Functional mobility during ADLs: Min guard;Rolling walker (2 wheels) ?  General ADL Comments: Pt able to ambulate with use of the RW to the regular toilet and complete sit to stand efficiently with use of the grab bar for support.  He reports having a counter in front of him that he uses to help get up at times as he just used his cane at home.  ? ? ? ?Vision Baseline Vision/History: 1 Wears glasses ?Ability  to See in Adequate Light: 0 Adequate ?Patient Visual Report: No change from baseline ?Vision Assessment?: No apparent visual deficits  ?   ?Perception Perception ?Perception: Within Functional Limits ?  ?Praxis Praxis ?Praxis: Intact ?  ? ?Pertinent Vitals/Pain Pain Assessment ?Pain Assessment: 0-10 ?Pain Score: 6  ?Pain Location: Neck and feet (neuropathy) ?Pain Intervention(s): Limited activity within patient's tolerance, Monitored during session  ? ? ? ?Hand Dominance Right ?  ?Extremity/Trunk Assessment Upper Extremity Assessment ?Upper Extremity Assessment: LUE deficits/detail ?LUE Deficits / Details: shoulder flexion 0-110 degrees, shoulder strength 3+5 as well as elbow flexion/extension and grip.  Slower finger to nose time compared to the RUE as well. ?LUE Sensation:  (reports numbness in his fingers, more than on the left side) ?LUE Coordination: decreased gross motor;decreased fine motor (Slower finger to nose compared to the right) ?  ?Lower Extremity Assessment ?Lower Extremity Assessment: Defer to PT evaluation ?  ?Cervical / Trunk Assessment ?Cervical / Trunk Assessment: Normal ?  ?Communication Communication ?Communication: No difficulties ?  ?Cognition Arousal/Alertness: Awake/alert ?Behavior During Therapy: [facility] for tasks assessed/performed ?Overall Cognitive Status: Within Functional Limits for tasks assessed ?  ?  ?  ?  ?  ?  ?  ?  ?  ?  ?  ?  ?  ?  ?  ?  ?  ?  ?  ?   ?   ?   ? ? ?Home Living Family/patient expects to be discharged to:: Private residence ?Living Arrangements: Alone ?Available Help at Discharge: Family (Lives 14 miles away) ?Type of Home: House ?Home Access: Stairs to enter ?Entrance Stairs-Number of Steps: 3 ?Entrance Stairs-Rails: None ?Home Layout: One level ?  ?  ?Bathroom Shower/Tub: Tub/shower unit ?  ?Bathroom Toilet: Standard ?Bathroom Accessibility: Yes ?  ?Home Equipment: [place] Mor - single point ?  ?Additional Comments: Used the cane all the time ?  ? ?  ?Prior  Functioning/Environment Prior Level of Function : Independent/Modified Independent ?  ?  ?  ?  ?  ?  ?  ?  ?  ? ?  ?  ?OT Problem List: Decreased strength;Decreased knowledge of use of DME or AE;Decreased range of motion;Decreased coordination;Impaired UE functional use;Impaired balance (sitting and/or standing) ?  ?   ?OT Treatment/Interventions: Self-care/ADL training;Therapeutic exercise;Patient/family education;Balance training;Neuromuscular education;Therapeutic activities;DME and/or AE instruction  ?  ?OT Goals(Current goals can be found in the care plan section) Acute Rehab OT Goals ?Patient Stated Goal: Get his strength back in the left side and go back home ?OT Goal Formulation: With patient ?Time For Goal Achievement: 05/13/21 ?Potential to Achieve Goals: Good  ?OT Frequency: Min 2X/week ?  ? ?   ?AM-PAC OT "6 Clicks" Daily Activity     ?Outcome Measure Help from another person eating meals?: None ?Help from another person taking care of personal grooming?: None ?Help from another person toileting, which includes using toliet, bedpan, or urinal?: A Little ?Help from another person bathing (including washing, rinsing, drying)?: A Little ?Help from another person to put on and taking off regular upper body clothing?: None ?Help from another person  to put on and taking off regular lower body clothing?: A Little ?6 Click Score: 21 ?  ?End of Session Equipment Utilized During Treatment: Gait belt;Rolling walker (2 wheels) ?Nurse Communication: Mobility status ? ?Activity Tolerance: Patient tolerated treatment well ?Patient left: in bed;with call bell/phone within reach ? ?OT Visit Diagnosis: Unsteadiness on feet (R26.81);Repeated falls (R29.6);Muscle weakness (generalized) (M62.81);Other (comment) (left side weakness)  ?              ?Time: 4259-5638 ?OT Time Calculation (min): 50 min ?Charges:  OT General Charges ?$OT Visit: 1 Visit ?OT Evaluation ?$OT Eval Moderate Complexity: 1 Mod ?OT Treatments ?$Self  Care/Home Management : 23-37 mins ? ? ?Hema Lanza OTR/L ?04/29/2021, 9:55 AM ?

## 2021-04-30 ENCOUNTER — Inpatient Hospital Stay (HOSPITAL_COMMUNITY): Payer: Medicare HMO

## 2021-04-30 ENCOUNTER — Inpatient Hospital Stay (HOSPITAL_COMMUNITY): Payer: Medicare HMO | Admitting: Certified Registered"

## 2021-04-30 ENCOUNTER — Encounter (HOSPITAL_COMMUNITY): Payer: Self-pay | Admitting: Internal Medicine

## 2021-04-30 ENCOUNTER — Other Ambulatory Visit: Payer: Self-pay

## 2021-04-30 ENCOUNTER — Encounter (HOSPITAL_COMMUNITY)
Admission: EM | Disposition: A | Discharge status: Home Health | Payer: Self-pay | Source: Home / Self Care | Attending: Internal Medicine

## 2021-04-30 DIAGNOSIS — G959 Disease of spinal cord, unspecified: Secondary | ICD-10-CM

## 2021-04-30 DIAGNOSIS — M4802 Spinal stenosis, cervical region: Secondary | ICD-10-CM

## 2021-04-30 DIAGNOSIS — E871 Hypo-osmolality and hyponatremia: Secondary | ICD-10-CM | POA: Diagnosis not present

## 2021-04-30 HISTORY — PX: ANTERIOR CERVICAL DECOMP/DISCECTOMY FUSION: SHX1161

## 2021-04-30 LAB — URINALYSIS, ROUTINE W REFLEX MICROSCOPIC
Bilirubin Urine: NEGATIVE
Glucose, UA: NEGATIVE mg/dL
Hgb urine dipstick: NEGATIVE
Ketones, ur: 5 mg/dL — AB
Leukocytes,Ua: NEGATIVE
Nitrite: NEGATIVE
Protein, ur: NEGATIVE mg/dL
Specific Gravity, Urine: 1.011 (ref 1.005–1.030)
pH: 6 (ref 5.0–8.0)

## 2021-04-30 LAB — BASIC METABOLIC PANEL
Anion gap: 8 (ref 5–15)
BUN: 23 mg/dL (ref 8–23)
CO2: 23 mmol/L (ref 22–32)
Calcium: 9.2 mg/dL (ref 8.9–10.3)
Chloride: 100 mmol/L (ref 98–111)
Creatinine, Ser: 1.35 mg/dL — ABNORMAL HIGH (ref 0.61–1.24)
GFR, Estimated: 56 mL/min — ABNORMAL LOW (ref >60)
Glucose, Bld: 98 mg/dL (ref 70–99)
Potassium: 3.9 mmol/L (ref 3.5–5.1)
Sodium: 131 mmol/L — ABNORMAL LOW (ref 135–145)

## 2021-04-30 LAB — CBC
HCT: 42.8 % (ref 39.0–52.0)
Hemoglobin: 14.6 g/dL (ref 13.0–17.0)
MCH: 31.1 pg (ref 26.0–34.0)
MCHC: 34.1 g/dL (ref 30.0–36.0)
MCV: 91.1 fL (ref 80.0–100.0)
Platelets: 200 10*3/uL (ref 150–400)
RBC: 4.7 MIL/uL (ref 4.22–5.81)
RDW: 13.1 % (ref 11.5–15.5)
WBC: 8.9 10*3/uL (ref 4.0–10.5)
nRBC: 0 % (ref 0.0–0.2)

## 2021-04-30 LAB — MAGNESIUM: Magnesium: 1.8 mg/dL (ref 1.7–2.4)

## 2021-04-30 LAB — TYPE AND SCREEN
ABO/RH(D): A POS
Antibody Screen: NEGATIVE

## 2021-04-30 LAB — SURGICAL PCR SCREEN
MRSA, PCR: NEGATIVE
Staphylococcus aureus: NEGATIVE

## 2021-04-30 SURGERY — ANTERIOR CERVICAL DECOMPRESSION/DISCECTOMY FUSION 1 LEVEL/HARDWARE REMOVAL
Anesthesia: General | Site: Neck

## 2021-04-30 MED ORDER — CHLORHEXIDINE GLUCONATE 0.12 % MT SOLN
OROMUCOSAL | Status: AC
  Administered 2021-04-30: 15 mL via OROMUCOSAL
  Filled 2021-04-30: qty 15

## 2021-04-30 MED ORDER — ONDANSETRON HCL 4 MG/2ML IJ SOLN
INTRAMUSCULAR | Status: AC
  Filled 2021-04-30: qty 2

## 2021-04-30 MED ORDER — PROPOFOL 10 MG/ML IV BOLUS
INTRAVENOUS | Status: DC | PRN
  Administered 2021-04-30: 10 mg via INTRAVENOUS
  Administered 2021-04-30: 20 mg via INTRAVENOUS
  Administered 2021-04-30: 140 mg via INTRAVENOUS
  Administered 2021-04-30: 20 mg via INTRAVENOUS

## 2021-04-30 MED ORDER — SUGAMMADEX SODIUM 200 MG/2ML IV SOLN
INTRAVENOUS | Status: DC | PRN
  Administered 2021-04-30: 200 mg via INTRAVENOUS

## 2021-04-30 MED ORDER — ACETAMINOPHEN 10 MG/ML IV SOLN
INTRAVENOUS | Status: AC
  Filled 2021-04-30: qty 100

## 2021-04-30 MED ORDER — THROMBIN 5000 UNITS EX SOLR
CUTANEOUS | Status: AC
  Filled 2021-04-30: qty 15000

## 2021-04-30 MED ORDER — PHENYLEPHRINE HCL-NACL 20-0.9 MG/250ML-% IV SOLN
INTRAVENOUS | Status: DC | PRN
  Administered 2021-04-30: 40 ug/min via INTRAVENOUS

## 2021-04-30 MED ORDER — CHLORHEXIDINE GLUCONATE 0.12 % MT SOLN
15.0000 mL | Freq: Once | OROMUCOSAL | Status: AC
Start: 2021-04-30 — End: 2021-04-30
  Filled 2021-04-30: qty 15

## 2021-04-30 MED ORDER — PHENYLEPHRINE 40 MCG/ML (10ML) SYRINGE FOR IV PUSH (FOR BLOOD PRESSURE SUPPORT)
PREFILLED_SYRINGE | INTRAVENOUS | Status: AC
  Filled 2021-04-30: qty 20

## 2021-04-30 MED ORDER — LIDOCAINE 2% (20 MG/ML) 5 ML SYRINGE
INTRAMUSCULAR | Status: AC
  Filled 2021-04-30: qty 5

## 2021-04-30 MED ORDER — BUPIVACAINE HCL 0.5 % IJ SOLN
INTRAMUSCULAR | Status: DC | PRN
  Administered 2021-04-30: 3.5 mL

## 2021-04-30 MED ORDER — LIDOCAINE-EPINEPHRINE 1 %-1:100000 IJ SOLN
INTRAMUSCULAR | Status: AC
  Filled 2021-04-30: qty 1

## 2021-04-30 MED ORDER — FENTANYL CITRATE (PF) 250 MCG/5ML IJ SOLN
INTRAMUSCULAR | Status: DC | PRN
Start: 2021-04-30 — End: 2021-04-30
  Administered 2021-04-30 (×5): 50 ug via INTRAVENOUS

## 2021-04-30 MED ORDER — DEXAMETHASONE SODIUM PHOSPHATE 10 MG/ML IJ SOLN
INTRAMUSCULAR | Status: DC | PRN
Start: 2021-04-30 — End: 2021-04-30
  Administered 2021-04-30: 10 mg via INTRAVENOUS

## 2021-04-30 MED ORDER — THROMBIN 5000 UNITS EX SOLR
CUTANEOUS | Status: DC | PRN
Start: 2021-04-30 — End: 2021-04-30
  Administered 2021-04-30 (×2): 5000 [IU] via TOPICAL

## 2021-04-30 MED ORDER — ONDANSETRON HCL 4 MG/2ML IJ SOLN
INTRAMUSCULAR | Status: DC | PRN
Start: 2021-04-30 — End: 2021-04-30
  Administered 2021-04-30: 4 mg via INTRAVENOUS

## 2021-04-30 MED ORDER — KETOROLAC TROMETHAMINE 30 MG/ML IJ SOLN
INTRAMUSCULAR | Status: AC
  Filled 2021-04-30: qty 1

## 2021-04-30 MED ORDER — LIDOCAINE 2% (20 MG/ML) 5 ML SYRINGE
INTRAMUSCULAR | Status: DC | PRN
  Administered 2021-04-30: 40 mg via INTRAVENOUS
  Administered 2021-04-30: 60 mg via INTRAVENOUS

## 2021-04-30 MED ORDER — ACETAMINOPHEN 10 MG/ML IV SOLN
INTRAVENOUS | Status: DC | PRN
  Administered 2021-04-30: 1000 mg via INTRAVENOUS

## 2021-04-30 MED ORDER — 0.9 % SODIUM CHLORIDE (POUR BTL) OPTIME
TOPICAL | Status: DC | PRN
  Administered 2021-04-30: 1000 mL

## 2021-04-30 MED ORDER — PHENYLEPHRINE HCL (PRESSORS) 10 MG/ML IV SOLN
INTRAVENOUS | Status: AC
  Filled 2021-04-30: qty 1

## 2021-04-30 MED ORDER — THROMBIN 5000 UNITS EX SOLR
OROMUCOSAL | Status: DC | PRN
  Administered 2021-04-30: 5 mL via TOPICAL

## 2021-04-30 MED ORDER — MIDAZOLAM HCL 2 MG/2ML IJ SOLN
INTRAMUSCULAR | Status: AC
  Filled 2021-04-30: qty 2

## 2021-04-30 MED ORDER — ROCURONIUM BROMIDE 10 MG/ML (PF) SYRINGE
PREFILLED_SYRINGE | INTRAVENOUS | Status: DC | PRN
  Administered 2021-04-30: 10 mg via INTRAVENOUS
  Administered 2021-04-30: 60 mg via INTRAVENOUS

## 2021-04-30 MED ORDER — MIDAZOLAM HCL 2 MG/2ML IJ SOLN
INTRAMUSCULAR | Status: DC | PRN
  Administered 2021-04-30: 2 mg via INTRAVENOUS

## 2021-04-30 MED ORDER — LACTATED RINGERS IV SOLN: INTRAVENOUS | Status: AC

## 2021-04-30 MED ORDER — HEMOSTATIC AGENTS (NO CHARGE) OPTIME
TOPICAL | Status: DC | PRN
Start: 2021-04-30 — End: 2021-04-30
  Administered 2021-04-30: 1 via TOPICAL

## 2021-04-30 MED ORDER — PROPOFOL 10 MG/ML IV BOLUS
INTRAVENOUS | Status: AC
  Filled 2021-04-30: qty 20

## 2021-04-30 MED ORDER — ROCURONIUM BROMIDE 10 MG/ML (PF) SYRINGE
PREFILLED_SYRINGE | INTRAVENOUS | Status: AC
  Filled 2021-04-30: qty 10

## 2021-04-30 MED ORDER — BUPIVACAINE HCL (PF) 0.5 % IJ SOLN
INTRAMUSCULAR | Status: AC
  Filled 2021-04-30: qty 30

## 2021-04-30 MED ORDER — CEFAZOLIN SODIUM-DEXTROSE 2-3 GM-%(50ML) IV SOLR
INTRAVENOUS | Status: DC | PRN
  Administered 2021-04-30: 2 g via INTRAVENOUS

## 2021-04-30 MED ORDER — PHENYLEPHRINE 40 MCG/ML (10ML) SYRINGE FOR IV PUSH (FOR BLOOD PRESSURE SUPPORT)
PREFILLED_SYRINGE | INTRAVENOUS | Status: DC | PRN
  Administered 2021-04-30: 40 ug via INTRAVENOUS
  Administered 2021-04-30 (×2): 160 ug via INTRAVENOUS
  Administered 2021-04-30: 80 ug via INTRAVENOUS

## 2021-04-30 MED ORDER — LIDOCAINE-EPINEPHRINE 1 %-1:100000 IJ SOLN
INTRAMUSCULAR | Status: DC | PRN
  Administered 2021-04-30: 3.5 mL via INTRADERMAL

## 2021-04-30 MED ORDER — FENTANYL CITRATE (PF) 250 MCG/5ML IJ SOLN
INTRAMUSCULAR | Status: AC
  Filled 2021-04-30: qty 5

## 2021-04-30 MED ORDER — DEXAMETHASONE SODIUM PHOSPHATE 10 MG/ML IJ SOLN
INTRAMUSCULAR | Status: AC
  Filled 2021-04-30: qty 1

## 2021-04-30 SURGICAL SUPPLY — 62 items
BAG COUNTER SPONGE SURGICOUNT (BAG) ×2 IMPLANT
BAND RUBBER #18 3X1/16 STRL (MISCELLANEOUS) ×4 IMPLANT
BENZOIN TINCTURE PRP APPL 2/3 (GAUZE/BANDAGES/DRESSINGS) IMPLANT
BLADE CLIPPER SURG (BLADE) IMPLANT
BLADE SURG 11 STRL SS (BLADE) ×2 IMPLANT
BLADE ULTRA TIP 2M (BLADE) IMPLANT
BUR MATCHSTICK NEURO 3.0 LAGG (BURR) ×2 IMPLANT
CANISTER SUCT 3000ML PPV (MISCELLANEOUS) ×2 IMPLANT
CARTRIDGE OIL MAESTRO DRILL (MISCELLANEOUS) ×1 IMPLANT
DECANTER SPIKE VIAL GLASS SM (MISCELLANEOUS) ×2 IMPLANT
DERMABOND ADVANCED (GAUZE/BANDAGES/DRESSINGS) ×1
DERMABOND ADVANCED .7 DNX12 (GAUZE/BANDAGES/DRESSINGS) ×1 IMPLANT
DEVICE ENDSKLTN TC NANOLCK 6MM (Cage) IMPLANT
DIFFUSER DRILL AIR PNEUMATIC (MISCELLANEOUS) ×2 IMPLANT
DRAIN CHANNEL 10M FLAT 3/4 FLT (DRAIN) IMPLANT
DRAPE C-ARM 42X72 X-RAY (DRAPES) ×4 IMPLANT
DRAPE HALF SHEET 40X57 (DRAPES) IMPLANT
DRAPE LAPAROTOMY 100X72 PEDS (DRAPES) ×2 IMPLANT
DRAPE MICROSCOPE LEICA (MISCELLANEOUS) ×2 IMPLANT
DRSG OPSITE 4X5.5 SM (GAUZE/BANDAGES/DRESSINGS) ×4 IMPLANT
DURAPREP 6ML APPLICATOR 50/CS (WOUND CARE) ×2 IMPLANT
ELECT COATED BLADE 2.86 ST (ELECTRODE) ×2 IMPLANT
ELECT REM PT RETURN 9FT ADLT (ELECTROSURGICAL) ×2
ELECTRODE REM PT RTRN 9FT ADLT (ELECTROSURGICAL) ×1 IMPLANT
ENDOSKELETON TC NANOLOCK 6MM (Cage) ×2 IMPLANT
EVACUATOR SILICONE 100CC (DRAIN) IMPLANT
GAUZE 4X4 16PLY ~~LOC~~+RFID DBL (SPONGE) IMPLANT
GEL ULTRASOUND 20GR AQUASONIC (MISCELLANEOUS) ×1 IMPLANT
GLOVE EXAM NITRILE XL STR (GLOVE) IMPLANT
GLOVE SURG ENC MOIS LTX SZ7.5 (GLOVE) IMPLANT
GLOVE SURG LTX SZ7 (GLOVE) ×3 IMPLANT
GLOVE SURG POLYISO LF SZ7 (GLOVE) ×3 IMPLANT
GLOVE SURG UNDER POLY LF SZ7.5 (GLOVE) ×4 IMPLANT
GOWN STRL REUS W/ TWL LRG LVL3 (GOWN DISPOSABLE) ×2 IMPLANT
GOWN STRL REUS W/ TWL XL LVL3 (GOWN DISPOSABLE) IMPLANT
GOWN STRL REUS W/TWL 2XL LVL3 (GOWN DISPOSABLE) IMPLANT
GOWN STRL REUS W/TWL LRG LVL3 (GOWN DISPOSABLE) ×3
GOWN STRL REUS W/TWL XL LVL3 (GOWN DISPOSABLE)
HEMOSTAT POWDER KIT SURGIFOAM (HEMOSTASIS) ×2 IMPLANT
KIT BASIN OR (CUSTOM PROCEDURE TRAY) ×2 IMPLANT
KIT TURNOVER KIT B (KITS) ×2 IMPLANT
NDL SPNL 22GX3.5 QUINCKE BK (NEEDLE) ×1 IMPLANT
NEEDLE HYPO 22GX1.5 SAFETY (NEEDLE) ×2 IMPLANT
NEEDLE SPNL 22GX3.5 QUINCKE BK (NEEDLE) ×2 IMPLANT
NS IRRIG 1000ML POUR BTL (IV SOLUTION) ×2 IMPLANT
OIL CARTRIDGE MAESTRO DRILL (MISCELLANEOUS) ×2
PACK LAMINECTOMY NEURO (CUSTOM PROCEDURE TRAY) ×2 IMPLANT
PAD ARMBOARD 7.5X6 YLW CONV (MISCELLANEOUS) ×6 IMPLANT
PLATE ZEVO 1LVL 21MM (Plate) ×1 IMPLANT
PUTTY DBF 1CC CORTICAL FIBERS (Putty) ×1 IMPLANT
RASP HELIOCORDIAL MED (MISCELLANEOUS) ×1 IMPLANT
SCREW 3.5 SELFTAP 15MM VARI (Screw) ×3 IMPLANT
SCREW 4.0 SELFTAP 15MM VARI (Screw) ×1 IMPLANT
SPONGE INTESTINAL PEANUT (DISPOSABLE) ×2 IMPLANT
SPONGE SURGIFOAM ABS GEL SZ50 (HEMOSTASIS) ×2 IMPLANT
STRIP CLOSURE SKIN 1/2X4 (GAUZE/BANDAGES/DRESSINGS) IMPLANT
SUT VIC AB 3-0 SH 8-18 (SUTURE) ×2 IMPLANT
SUT VICRYL 3-0 RB1 18 ABS (SUTURE) ×4 IMPLANT
TAPE CLOTH 3X10 TAN LF (GAUZE/BANDAGES/DRESSINGS) ×2 IMPLANT
TOWEL GREEN STERILE (TOWEL DISPOSABLE) ×2 IMPLANT
TOWEL GREEN STERILE FF (TOWEL DISPOSABLE) ×2 IMPLANT
WATER STERILE IRR 1000ML POUR (IV SOLUTION) ×2 IMPLANT

## 2021-04-30 NOTE — Anesthesia Preprocedure Evaluation (Signed)
Anesthesia Evaluation  ?Patient identified by MRN, date of birth, ID band ?Patient awake ? ? ? ?Reviewed: ?Allergy & Precautions, NPO status , Patient's Chart, lab work & pertinent test results ? ?Airway ?Mallampati: II ? ?TM Distance: >3 FB ?Neck ROM: Full ? ? ? Dental ? ?(+) Dental Advisory Given ?  ?Pulmonary ?neg pulmonary ROS,  ?  ?breath sounds clear to auscultation ? ? ? ? ? ? Cardiovascular ?hypertension, Pt. on medications and Pt. on home beta blockers ? ?Rhythm:Regular Rate:Normal ? ? ?  ?Neuro/Psych ?negative neurological ROS ?   ? GI/Hepatic ?Neg liver ROS, GERD  Medicated,  ?Endo/Other  ?negative endocrine ROS ? Renal/GU ?CRFRenal disease  ? ?  ?Musculoskeletal ? ? Abdominal ?  ?Peds ? Hematology ?negative hematology ROS ?(+)   ?Anesthesia Other Findings ? ? Reproductive/Obstetrics ? ?  ? ? ? ? ? ? ? ? ? ? ? ? ? ?  ?  ? ? ? ? ? ? ? ? ?Lab Results  ?Component Value Date  ? WBC 8.9 04/30/2021  ? HGB 14.6 04/30/2021  ? HCT 42.8 04/30/2021  ? MCV 91.1 04/30/2021  ? PLT 200 04/30/2021  ? ?Lab Results  ?Component Value Date  ? CREATININE 1.35 (H) 04/30/2021  ? BUN 23 04/30/2021  ? NA 131 (L) 04/30/2021  ? K 3.9 04/30/2021  ? CL 100 04/30/2021  ? CO2 23 04/30/2021  ? ? ?Anesthesia Physical ?Anesthesia Plan ? ?ASA: 3 ? ?Anesthesia Plan: General  ? ?Post-op Pain Management: Toradol IV (intra-op)*  ? ?Induction:  ? ?PONV Risk Score and Plan: 2 and Dexamethasone, Ondansetron and Treatment may vary due to age or medical condition ? ?Airway Management Planned: Oral ETT and Video Laryngoscope Planned ? ?Additional Equipment:  ? ?Intra-op Plan:  ? ?Post-operative Plan:  ? ?Informed Consent:  ? ?Plan Discussed with:  ? ?Anesthesia Plan Comments:   ? ? ? ? ? ? ?Anesthesia Quick Evaluation ? ?

## 2021-04-30 NOTE — Progress Notes (Signed)
PT Cancellation Note ? ?Patient Details ?Name: MEET DAMIAN ?MRN: [id number] ?DOB: 07/03/1950 ? ? ?Cancelled Treatment:    Reason Eval/Treat Not Completed: Other (comment) Plan for ACDF C3-4 this AM. PT will re-attempt once medically ready.  ? ?Ondrea Dow A. Gilford Rile, PT, DPT ?Acute Rehabilitation Services ?Pager [phone] ?Office [phone] ? ? ? ?Tip Atienza A Shaheim Mahar ?04/30/2021, 7:47 AM ?

## 2021-04-30 NOTE — Progress Notes (Signed)
OT Cancellation Note ? ?Patient Details ?Name: Gabriel Stein ?MRN: [id number] ?DOB: May 26, 1950 ? ? ?Cancelled Treatment:    Reason Eval/Treat Not Completed: Patient at procedure or test/ unavailable. Patient planned surgery for ACDF C3-4 this AM. OT will follow up next available time as appropriate ? ?Galen Manila ?04/30/2021, 9:07 AM ?

## 2021-04-30 NOTE — Progress Notes (Signed)
SLP Cancellation Note ? ?Patient Details ?Name: Gabriel Stein ?MRN: [id number] ?DOB: 04-17-1950 ? ? ?Cancelled treatment:       Reason Eval/Treat Not Completed: Patient not medically ready;Other (comment) (Order recieved for clinical/bedside swallow evaluation with SLP. Patient planned surgery for ACDF C3-4 this AM. SLP will follow up later this date or next date for patient readiness.) ? ? ?Angela Nevin, MA, CCC-SLP ?Speech Therapy ? ?

## 2021-04-30 NOTE — Evaluation (Signed)
Physical Therapy Evaluation ?Patient Details ?Name: Gabriel Stein ?MRN: [id number] ?DOB: 1950-07-04 ?Today's Date: 04/30/2021 ? ?History of Present Illness ? 71 y/o male presented to ED on 04/28/21 following L sided arm and leg weakness after fall x 3 days ago and recent fall x1 day ago. CT spine showed previous surgical fusion at C4-5 and C5-6 while showing spinal stenosis at C3-4 and C6-7 levels. S/p ACDF C3-4 on 3/25. PMH: HTN, hx of ACDF.  ?Clinical Impression ? Patient admitted with the above. Patient presents with weakness, impaired balance, decreased activity tolerance, and mild coordination deficits. Educated patient on cervical precautions, patient demonstrated understanding but prefers use of soft collar for comfort, notified RN. Patient ambulated short distance with no AD and required modA as well as reaching for objects in environment. Improved stability with use of RW but demos mild ataxia and does not use RW at baseline. Patient motivated to return to independence after multiple falls and recent procedure. Patient will benefit from skilled PT services during acute stay to address listed deficits. Recommend AIR at discharge to maximize functional independence in hopes to return home independently.  ?   ? ?Recommendations for follow up therapy are one component of a multi-disciplinary discharge planning process, led by the attending physician.  Recommendations may be updated based on patient status, additional functional criteria and insurance authorization. ? ?Follow Up Recommendations Acute inpatient rehab (3hours/day) ? ?  ?Assistance Recommended at Discharge Intermittent Supervision/Assistance  ?Patient can return home with the following ? A little help with walking and/or transfers;A little help with bathing/dressing/bathroom;Assistance with cooking/housework;Assist for transportation;Help with stairs or ramp for entrance ? ?  ?Equipment Recommendations Rolling Aithana Kushner (2 wheels)  ?Recommendations for  Other Services ? Rehab consult  ?  ?Functional Status Assessment Patient has had a recent decline in their functional status and demonstrates the ability to make significant improvements in function in a reasonable and predictable amount of time.  ? ?  ?Precautions / Restrictions Precautions ?Precautions: Fall;Cervical ?Precaution Booklet Issued: No ?Precaution Comments: reviewed cervical precautions ?Required Braces or Orthoses: Cervical Brace ?Cervical Brace: Soft collar (notified RN that patient would like soft collar for comfort) ?Restrictions ?Weight Bearing Restrictions: No  ? ?  ? ?Mobility ? Bed Mobility ?Overal bed mobility: Needs Assistance ?Bed Mobility: Rolling, Sidelying to Sit, Sit to Sidelying ?Rolling: Supervision ?Sidelying to sit: Supervision ?  ?  ?Sit to sidelying: Supervision ?General bed mobility comments: supervision for safety. Cues for log roll technique ?  ? ?Transfers ?Overall transfer level: Needs assistance ?Equipment used: None ?Transfers: Sit to/from Stand ?Sit to Stand: Min assist ?  ?  ?  ?  ?  ?General transfer comment: minA to steady upon standing and patient reaching for objects to hold onto ?  ? ?Ambulation/Gait ?Ambulation/Gait assistance: Mod assist, Min guard ?Gait Distance (Feet): 10 Feet (+100') ?Assistive device: None, Rolling Rigo Letts (2 wheels) ?Gait Pattern/deviations: Step-through pattern, Decreased stride length, Ataxic ?Gait velocity: decreased ?  ?  ?General Gait Details: modA for balance with ambulating short distance with no AD and patient reaching for objects. Provided RW with better stability due to more support. Mild ataxia with mobility. Was not using Mack Alvidrez prior to admission and would like to not use RW ? ?Stairs ?  ?  ?  ?  ?  ? ?Wheelchair Mobility ?  ? ?Modified Rankin (Stroke Patients Only) ?  ? ?  ? ?Balance Overall balance assessment: Needs assistance ?Sitting-balance support: No upper extremity supported ?Sitting balance-Leahy Scale: Good ?  ?  ?  Standing  balance support: During functional activity, No upper extremity supported ?Standing balance-Leahy Scale: Poor ?Standing balance comment: modA for balance ?  ?  ?  ?  ?  ?  ?  ?  ?  ?  ?  ?   ? ? ? ?Pertinent Vitals/Pain Pain Assessment ?Pain Assessment: 0-10 ?Pain Score: 3  ?Pain Location: neck ?Pain Descriptors / Indicators: Discomfort ?Pain Intervention(s): Monitored during session  ? ? ?Home Living Family/patient expects to be discharged to:: Private residence ?Living Arrangements: Alone ?Available Help at Discharge: Family (Lives 14 miles away) ?Type of Home: House ?Home Access: Stairs to enter ?Entrance Stairs-Rails: None ?Entrance Stairs-Number of Steps: 3 ?  ?Home Layout: One level ?Home Equipment: Gilmer Mor - single point ?   ?  ?Prior Function Prior Level of Function : Independent/Modified Independent;History of Falls (last six months) ?  ?  ?  ?  ?  ?  ?Mobility Comments: uses cane recently since frequent falls ?  ?  ? ? ?Hand Dominance  ?   ? ?  ?Extremity/Trunk Assessment  ? Upper Extremity Assessment ?Upper Extremity Assessment: Defer to OT evaluation ?  ? ?Lower Extremity Assessment ?Lower Extremity Assessment: Generalized weakness (L>R) ?  ? ?Cervical / Trunk Assessment ?Cervical / Trunk Assessment: Neck Surgery  ?Communication  ? Communication: No difficulties  ?Cognition Arousal/Alertness: Awake/alert ?Behavior During Therapy: [facility] for tasks assessed/performed ?Overall Cognitive Status: Within Functional Limits for tasks assessed ?  ?  ?  ?  ?  ?  ?  ?  ?  ?  ?  ?  ?  ?  ?  ?  ?  ?  ?  ? ?  ?General Comments General comments (skin integrity, edema, etc.): VSS on RA ? ?  ?Exercises    ? ?Assessment/Plan  ?  ?PT Assessment Patient needs continued PT services  ?PT Problem List Decreased strength;Decreased activity tolerance;Decreased balance;Decreased mobility;Decreased coordination ? ?   ?  ?PT Treatment Interventions DME instruction;Gait training;Stair training;Functional mobility training;Therapeutic  activities;Therapeutic exercise;Balance training;Patient/family education   ? ?PT Goals (Current goals can be found in the Care Plan section)  ?Acute Rehab PT Goals ?Patient Stated Goal: to be independent ?PT Goal Formulation: With patient ?Time For Goal Achievement: 05/14/21 ?Potential to Achieve Goals: Good ? ?  ?Frequency Min 5X/week ?  ? ? ?Co-evaluation   ?  ?  ?  ?  ? ? ?  ?AM-PAC PT "6 Clicks" Mobility  ?Outcome Measure Help needed turning from your back to your side while in a flat bed without using bedrails?: A Little ?Help needed moving from lying on your back to sitting on the side of a flat bed without using bedrails?: A Little ?Help needed moving to and from a bed to a chair (including a wheelchair)?: A Little ?Help needed standing up from a chair using your arms (e.g., wheelchair or bedside chair)?: A Little ?Help needed to walk in hospital room?: A Lot ?Help needed climbing 3-5 steps with a railing? : Total ?6 Click Score: 15 ? ?  ?End of Session Equipment Utilized During Treatment: Gait belt ?Activity Tolerance: Patient tolerated treatment well ?Patient left: in bed;with call bell/phone within reach ?Nurse Communication: Mobility status ?PT Visit Diagnosis: Unsteadiness on feet (R26.81);Muscle weakness (generalized) (M62.81);History of falling (Z91.81);Ataxic gait (R26.0);Difficulty in walking, not elsewhere classified (R26.2) ?  ? ?Time: 6812-7517 ?PT Time Calculation (min) (ACUTE ONLY): 23 min ? ? ?Charges:   PT Evaluation ?$PT Eval Moderate Complexity: 1 Mod ?PT Treatments ?$Gait  Training: 8-22 mins ?  ?   ? ? ?Sakari Alkhatib A. Dan Humphreys, PT, DPT ?Acute Rehabilitation Services ?Pager [phone] ?Office [phone] ? ? ?Hersh Minney A Anneli Bing ?04/30/2021, 4:27 PM ? ?

## 2021-04-30 NOTE — Op Note (Signed)
?NEUROSURGERY OPERATIVE NOTE  ? ?PREOP DIAGNOSIS: Cervical stenosis with myelopathy, C3-4 ? ?POSTOP DIAGNOSIS: Same ? ?PROCEDURE: ?1. Discectomy at C3-4 for decompression of spinal cord and exiting nerve roots  ?2. Placement of intervertebral biomechanical device Medtronic Titan 74mm medium cage ?3. Placement of anterior instrumentation consisting of interbody plate and screws - Medtronic Zevo 22mm plate, 98XQ screws ?4. Arthrodesis C3-4, anterior interbody technique  ?5. Removal of previous hardware - cut anterior CTSP cervical plate and C4 screws  ?6. Use of morselized bone allograft  ?7. Use of intraoperative microscope ? ?SURGEON: Dr. Lisbeth Renshaw, MD ? ?ASSISTANT: None ? ?ANESTHESIA: General Endotracheal ? ?EBL: 100cc ? ?SPECIMENS: None ? ?DRAINS: None ? ?COMPLICATIONS: None immediate ? ?CONDITION: Hemodynamically stable to PACU ? ?HISTORY: ?Gabriel Stein is a 71 y.o. y.o. male presenting to the hospital with generalized weakness slowly progressive over several weeks but acute worsening after a fall several days ago.  He noted asymmetric weakness worse on the left after this fall.  His work-up included MRI of the cervical spine which revealed severe stenosis at C3-4 adjacent to previous fusion at C4-C6.  Given his clinical and radiographic findings, surgical decompression was indicated.  The risks, benefits, and alternatives to surgery were all reviewed in detail with the patient.  After all his questions were answered informed consent was obtained and witnessed. ? ?PROCEDURE IN DETAIL: ?The patient was brought to the operating room and transferred to the operative table. After induction of general anesthesia, the patient was positioned on the operative table in the supine position with all pressure points meticulously padded.  Previous transverse left-sided neck incision was identified.  A new incision was marked out superior to the previous incision in order to access the C3-4 disc space.  The region  was then prepped and draped in the usual sterile fashion. ? ?After timeout was conducted, the skin was infiltrated with local anesthetic. Skin incision was then made sharply and Bovie electrocautery was used to dissect the subcutaneous tissue until the platysma was identified. The platysma was then divided and undermined. The sternocleidomastoid muscle was then identified and, utilizing natural fascial planes in the neck, the prevertebral fascia was identified and the carotid sheath was retracted laterally and the trachea and esophagus retracted medially.  The previous cervical plate was identified and the superior portion of this was dissected with the Bovie.  The C3-4 disc space was also identified just above the top of the plate.  The 2 C4 screws were identified as was the portion of the plate just inferior to this.  It was apparent that discectomy at C3-4 would not be possible without removal of the superior portion of this plate.  I therefore applied some lubricant to the surgical wound in order to catch metal shavings and the metal cutting bur was applied to the pneumatic drill.  The top of the cervical plate was then cut.  The previous C4 screws were then removed and the superior portion of the previous plate was removed also. ? ?At this point the disc space was incised sharply and rongeurs were use to initially complete a discectomy.  Because of severe collapse, I did place a Caspar distractor in order to widen the disc space.  The high-speed drill was then used to complete discectomy until the posterior annulus was identified and removed and the posterior longitudinal ligament was identified.  I did note relatively large posterior osteophyte at the level of the disc space.  The high-speed drill was used to  undercut both the C3 and C4 vertebral bodies.  The PLL was then elevated with a small nerve hook and initially cut with a 1 mm Kerrison punch.  The thecal sac was then fully decompressed with Kerrison  punches by removal of the thickened posterior longitudinal ligament as well as the large osteophytes arising from C3 and C4.  Once this was done I was able to freely pass a small dissector within the ventral epidural space. ? ?Having completed our decompression, attention was turned to placement of the intervertebral device. Trial spacers were used to select a medium with 6 mm lordotic graft. This graft was then filled with morcellized allograft, and inserted until it was flush with the vertebral bodies. ? ?After placement of the intervertebral device, the above anterior cervical plate was selected, and placed across the interspace.  I was able to utilize the previous screw hole on the right side at C4 to anchor the plate.  15 mm screws were then placed in C3 and on the left side at C4.  Final lateral fluoroscopic images were taken to confirm good location of the interbody device and anterior plate. ? ?At this point, after all counts were verified to be correct, meticulous hemostasis was secured using a combination of bipolar electrocautery and passive hemostatics. The platysma muscle was then closed using interrupted 3-0 Vicryl sutures, and the skin was closed with an interrupted 3-0 Vicry subcuticular stitch. Dermabond and sterile dressings were then applied and the drapes removed. ? ?The patient tolerated the procedure well and was extubated in the room and taken to the postanesthesia care unit in stable condition.  At the end of the case all sponge, needle, instrument, and cottonoid counts were correct. ? ? ?Lisbeth Renshaw, MD ?[facility]  ?

## 2021-04-30 NOTE — Progress Notes (Signed)
?  NEUROSURGERY PROGRESS NOTE  ? ?No issues overnight. Pt without new complaints. ? ?EXAM:  ?BP (!) 141/62   Pulse 79   Temp 98.1 ?F (36.7 ?C) (Oral)   Resp 16   Ht 5\' 6"  (1.676 m)   Wt 86.1 kg   SpO2 100%   BMI 30.64 kg/m?  ? ?Awake, alert, oriented  ?Speech fluent, appropriate  ?CN grossly intact  ?5/5 RUE/RLE ?Stable 4/5 LUE/LLE ? ?IMPRESSION:  ?71 y.o. male with severe stenosis with myelopathy C3-4 ? ?PLAN: ?- Proceed with ACDF C3-4 likely removal of hardware ? ?I have reviewed the situation again with the patient. All his questions were answered and he is ready to proceed with surgery. ? ? ?61, MD ?[facility]  ? ?

## 2021-04-30 NOTE — Progress Notes (Signed)
?PROGRESS NOTE ? ? ? ?CAPONE SCHWINN  TIW:[id number] DOB: 13-Dec-1950 DOA: 04/28/2021 ?PCP: Pcp, No  ? ? ? ?Brief Narrative:  ?Gabriel Stein is a 71 y.o. male with medical history significant for hypertension, hyperlipidemia, spinal stenosis of the cervical spine status post cervical fusion C4-C5 as well as C5-C6, spinal stenosis of the lumbar spine status post L4-L5 fusion, presents to [facility] ED complaining of weakness.  ? ?Subjective: ? ?Patient in bed, appears comfortable, denies any headache mild neck pain, no fever, no chest pain or pressure, no shortness of breath , no abdominal pain. No new focal weakness. ? ? ?Assessment & Plan: ?  ? ?Assessment and Plan: ? 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  Monitor. ?  ? ? ?Leukocytosis ?Reports was given steroids for low back pain recently ?Does not have sign of infection ?Wbc improved  ? ? ? Hyponatremia ? from pain? Hold chlorthalidone  ?Improved,  ?Monitor  ? 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  Will monitor.  Continue gentle hydration, will also check renal ultrasound & UA ? ?HTN ? Stable, continue zebeta, hold lisinopril  ? ?HLD ?Continue statin ? ?GERD (gastroesophageal reflux disease) ? continue home PPI.  ?  ?  ? ?I have Reviewed nursing notes, Vitals, pain scores, I/o's, Lab results and  imaging results since pt's last encounter, details please see discussion above  ?I ordered the following labs:  ?Unresulted Labs (From admission, onward)  ? ?  Start     Ordered  ? 05/01/21 0500  Magnesium  Daily,   R     ?Question:  Specimen collection method  Answer:  Lab=Lab collect  ? 04/30/21 0557  ? 05/01/21 0500  Comprehensive metabolic panel  Daily,   R     ?Question:  Specimen collection method  Answer:  Lab=Lab collect  ? 04/30/21 0557  ? 05/01/21 0500  CBC with Differential/Platelet  Daily,   R     ?Question:   Specimen collection method  Answer:  Lab=Lab collect  ? 04/30/21 0557  ? 04/28/21 2023  Methylmalonic acid, serum  Add-on,   AD       ?Question:  Specimen collection method  Answer:  Lab=Lab collect  ? 04/28/21 2022  ? ?  ?  ? ?  ? ? ? ?DVT prophylaxis: SCDs Start: 04/28/21 1952 ? ? ?Code Status:   Code Status: Full Code ? ?Family Communication: patient ?Disposition:  ? ?Status is: Inpatient ? ?  ? ?Dispo: The patient is from: home ?             Anticipated d/c is to: home ?             Anticipated d/c date is: TBD ? ?Antimicrobials:   ? ?Anti-infectives (From admission, onward)  ? ? None  ? ?  ? ? ?Objective: ?Vitals:  ? 04/29/21 2111 04/29/21 2345 04/30/21 0400 04/30/21 0500  ?BP:  115/61 (!) 103/49   ?Pulse: 68 72 70   ?Resp: 20 18 12    ?Temp: 98.1 ?F (36.7 ?C) 97.8 ?F (36.6 ?C) 98.1 ?F (36.7 ?C)   ?TempSrc: Oral Oral Oral   ?SpO2: 94% 97% 95%   ?Weight:    86.1 kg  ?Height:      ? ? ?Intake/Output Summary (Last 24 hours) at 04/30/2021 0809 ?Last data filed at 04/29/2021 2000 ?Gross per  24 hour  ?Intake 480 ml  ?Output 400 ml  ?Net 80 ml  ? ?Filed Weights  ? 04/28/21 1123 04/30/21 0500  ?Weight: 86.2 kg 86.1 kg  ? ? ?Examination: ? ?Awake Alert, No new F.N deficits, Normal affect ?[place].AT,PERRAL ?Supple Neck, No JVD,   ?Symmetrical Chest wall movement, Good air movement bilaterally, CTAB ?RRR,No Gallops, Rubs or new Murmurs,  ?+ve B.Sounds, Abd Soft, No tenderness,   ?No Cyanosis, Clubbing or edema  ? ? ?Recent Labs  ?Lab 04/28/21 ?1208 04/29/21 ?0103 04/30/21 ?2202  ?WBC 13.5* 11.3* 8.9  ?HGB 14.4 14.0 14.6  ?HCT 42.3 40.2 42.8  ?PLT 220 206 200  ?MCV 91.4 89.9 91.1  ?MCH 31.1 31.3 31.1  ?MCHC 34.0 34.8 34.1  ?RDW 12.8 12.8 13.1  ?LYMPHSABS  --  2.2  --   ?MONOABS  --  0.9  --   ?EOSABS  --  0.0  --   ?BASOSABS  --  0.0  --   ? ? ?Recent Labs  ?Lab 04/28/21 ?1208 04/29/21 ?0103 04/30/21 ?5427  ?NA 125* 130* 131*  ?K 4.5 4.0 3.9  ?CL 94* 100 100  ?CO2 18* 22 23  ?GLUCOSE 138* 97 98  ?BUN 23 22 23   ?CREATININE 1.35*  1.28* 1.35*  ?CALCIUM 9.5 9.3 9.2  ?AST 40 31  --   ?ALT 30 31  --   ?ALKPHOS 42 39  --   ?BILITOT 0.8 0.9  --   ?ALBUMIN 3.9 3.7  --   ?MG  --  1.9 1.8  ?TSH  --  2.072  --   ? ? ?  ?  ? ? ?Data Reviewed: I have personally reviewed  labs and visualized  imaging studies since the last encounter and formulate the plan  ? ? ?Scheduled Meds: ? [MAR Hold] aspirin  325 mg Oral Daily  ? [MAR Hold] bisoprolol  10 mg Oral Daily  ? [MAR Hold] pantoprazole  40 mg Oral Daily  ? [MAR Hold] simvastatin  20 mg Oral QPM  ? ?Continuous Infusions: ? lactated ringers 75 mL/hr at 04/30/21 0650  ? ? ? LOS: 2 days  ? ?Signature ? ?05/02/21 M.D on 04/30/2021 at 8:09 AM   -  To page go to [email or web address]  ? ?  ? ? ? ?

## 2021-04-30 NOTE — Transfer of Care (Signed)
Immediate Anesthesia Transfer of Care Note ? ?Patient: Gabriel Stein ? ?Procedure(s) Performed: Cervical three - four  ANTERIOR CERVICAL DECOMPRESSION/DISCECTOMY FUSION, HARDWARE REMOVAL (Neck) ? ?Patient Location: PACU ? ?Anesthesia Type:General ? ?Level of Consciousness: awake, patient cooperative and responds to stimulation ? ?Airway & Oxygen Therapy: Patient Spontanous Breathing and Patient connected to nasal cannula oxygen ? ?Post-op Assessment: Report given to RN, Post -op Vital signs reviewed and stable and Patient moving all extremities X 4 ? ?Post vital signs: Reviewed and stable ? ?Last Vitals:  ?Vitals Value Taken Time  ?BP 153/74 04/30/21 1154  ?Temp    ?Pulse 93 04/30/21 1158  ?Resp 15 04/30/21 1158  ?SpO2 97 % 04/30/21 1158  ?Vitals shown include unvalidated device data. ? ?Last Pain:  ?Vitals:  ? 04/30/21 0833  ?TempSrc:   ?PainSc: 6   ?   ? ?  ? ?Complications: No notable events documented. ?

## 2021-04-30 NOTE — Progress Notes (Signed)
Called patient's daughter Milhorn (as requested by daughter) for surgery update. ?

## 2021-04-30 NOTE — Anesthesia Postprocedure Evaluation (Signed)
Anesthesia Post Note ? ?Patient: Gabriel Stein ? ?Procedure(s) Performed: Cervical three - four  ANTERIOR CERVICAL DECOMPRESSION/DISCECTOMY FUSION, HARDWARE REMOVAL (Neck) ? ?  ? ?Patient location during evaluation: PACU ?Anesthesia Type: General ?Level of consciousness: awake and alert ?Pain management: pain level controlled ?Vital Signs Assessment: post-procedure vital signs reviewed and stable ?Respiratory status: spontaneous breathing, nonlabored ventilation, respiratory function stable and patient connected to nasal cannula oxygen ?Cardiovascular status: blood pressure returned to baseline and stable ?Postop Assessment: no apparent nausea or vomiting ?Anesthetic complications: no ? ? ?No notable events documented. ? ?Last Vitals:  ?Vitals:  ? 04/30/21 1311 04/30/21 1946  ?BP: 137/73 134/66  ?Pulse:  (!) 114  ?Resp:  19  ?Temp: 36.9 ?C 36.8 ?C  ?SpO2:  93%  ?  ?Last Pain:  ?Vitals:  ? 04/30/21 1946  ?TempSrc: Oral  ?PainSc:   ? ? ?  ?  ?  ?  ?  ?  ? ?Marcene Duos E ? ? ? ? ?

## 2021-04-30 NOTE — Anesthesia Procedure Notes (Signed)
Procedure Name: Intubation ?Date/Time: 04/30/2021 9:07 AM ?Performed by: Betha Loa, CRNA ?Pre-anesthesia Checklist: Patient identified, Emergency Drugs available, Suction available and Patient being monitored ?Patient Re-evaluated:Patient Re-evaluated prior to induction ?Oxygen Delivery Method: Circle System Utilized ?Preoxygenation: Pre-oxygenation with 100% oxygen ?Induction Type: IV induction ?Ventilation: Mask ventilation without difficulty and Oral airway inserted - appropriate to patient size ?Laryngoscope Size: Mac, 4 and Glidescope ?Grade View: Grade I ?Tube type: Oral ?Tube size: 7.5 mm ?Number of attempts: 1 ?Airway Equipment and Method: Stylet and Oral airway ?Placement Confirmation: ETT inserted through vocal cords under direct vision, positive ETCO2 and breath sounds checked- equal and bilateral ?Secured at: 22 cm ?Tube secured with: Tape ?Dental Injury: Teeth and Oropharynx as per pre-operative assessment  ?Difficulty Due To: Difficulty was anticipated, Difficult Airway- due to reduced neck mobility and Difficult Airway-  due to neck instability ?Comments: Elective Glidescope utilized 2* cervical stenosis and myelopathy.  Head and neck remain in neutral and midline position for induction and intubation. ? ? ? ? ?

## 2021-05-01 DIAGNOSIS — E871 Hypo-osmolality and hyponatremia: Secondary | ICD-10-CM | POA: Diagnosis not present

## 2021-05-01 LAB — COMPREHENSIVE METABOLIC PANEL
ALT: 41 U/L (ref 0–44)
AST: 33 U/L (ref 15–41)
Albumin: 3.6 g/dL (ref 3.5–5.0)
Alkaline Phosphatase: 39 U/L (ref 38–126)
Anion gap: 6 (ref 5–15)
BUN: 19 mg/dL (ref 8–23)
CO2: 25 mmol/L (ref 22–32)
Calcium: 9.3 mg/dL (ref 8.9–10.3)
Chloride: 100 mmol/L (ref 98–111)
Creatinine, Ser: 1.07 mg/dL (ref 0.61–1.24)
GFR, Estimated: >60 mL/min (ref >60)
Glucose, Bld: 129 mg/dL — ABNORMAL HIGH (ref 70–99)
Potassium: 3.9 mmol/L (ref 3.5–5.1)
Sodium: 131 mmol/L — ABNORMAL LOW (ref 135–145)
Total Bilirubin: 0.6 mg/dL (ref 0.3–1.2)
Total Protein: 5.9 g/dL — ABNORMAL LOW (ref 6.5–8.1)

## 2021-05-01 LAB — CBC WITH DIFFERENTIAL/PLATELET
Abs Immature Granulocytes: 0.06 10*3/uL (ref 0.00–0.07)
Basophils Absolute: 0 10*3/uL (ref 0.0–0.1)
Basophils Relative: 0 %
Eosinophils Absolute: 0 10*3/uL (ref 0.0–0.5)
Eosinophils Relative: 0 %
HCT: 42.6 % (ref 39.0–52.0)
Hemoglobin: 14.9 g/dL (ref 13.0–17.0)
Immature Granulocytes: 1 %
Lymphocytes Relative: 9 %
Lymphs Abs: 1.2 10*3/uL (ref 0.7–4.0)
MCH: 31.5 pg (ref 26.0–34.0)
MCHC: 35 g/dL (ref 30.0–36.0)
MCV: 90.1 fL (ref 80.0–100.0)
Monocytes Absolute: 1 10*3/uL (ref 0.1–1.0)
Monocytes Relative: 8 %
Neutro Abs: 10.9 10*3/uL — ABNORMAL HIGH (ref 1.7–7.7)
Neutrophils Relative %: 82 %
Platelets: 220 10*3/uL (ref 150–400)
RBC: 4.73 MIL/uL (ref 4.22–5.81)
RDW: 12.7 % (ref 11.5–15.5)
WBC: 13.2 10*3/uL — ABNORMAL HIGH (ref 4.0–10.5)
nRBC: 0 % (ref 0.0–0.2)

## 2021-05-01 LAB — METHYLMALONIC ACID, SERUM: Methylmalonic Acid, Quantitative: 110 nmol/L (ref 0–378)

## 2021-05-01 LAB — MAGNESIUM: Magnesium: 1.8 mg/dL (ref 1.7–2.4)

## 2021-05-01 MED ORDER — HYDRALAZINE HCL 20 MG/ML IJ SOLN
10.0000 mg | Freq: Four times a day (QID) | INTRAMUSCULAR | Status: DC | PRN
Start: 2021-05-01 — End: 2021-05-02

## 2021-05-01 MED ORDER — LISINOPRIL 10 MG PO TABS
10.0000 mg | ORAL_TABLET | Freq: Every day | ORAL | Status: DC
  Administered 2021-05-01 – 2021-05-02 (×2): 10 mg via ORAL
  Filled 2021-05-01 (×2): qty 1

## 2021-05-01 NOTE — Evaluation (Signed)
Clinical/Bedside Swallow Evaluation ?Patient Details  ?Name: Gabriel Stein ?MRN: [id number] ?Date of Birth: [birth year] ? ?Today's Date: 05/01/2021 ?Time: SLP Start Time (ACUTE ONLY): [id number] SLP Stop Time (ACUTE ONLY): 1425 ?SLP Time Calculation (min) (ACUTE ONLY): 20 min ? ?Past Medical History:  ?Past Medical History:  ?Diagnosis Date  ? Allergic rhinitis   ? Cervical stenosis of spine   ? COPD (chronic obstructive pulmonary disease) ([place])   ? Depression   ? GAD (generalized anxiety disorder)   ? Hypercholesteremia   ? Hypertension   ? Lumbar stenosis   ? ?Past Surgical History:  ?Past Surgical History:  ?Procedure Laterality Date  ? BACK SURGERY    ? REPLACEMENT TOTAL KNEE Right   ? ?HPI:  ?Patient is a 71 y.o. male with PMH: HTN, prior ACDF who presented to the ED on 04/28/21 following left sided arm and leg weakness after fall 3 days prior and another more recent fall 1 day ago. CDT spine showed previous surgical fusion at C4-5 and C5-6 and showing spinal stenosis at C3-4 and C6-7. Patient underwent ACDF of C3-4 on 3/25.  ?  ?Assessment / Plan / Recommendation  ?Clinical Impression ? Patient not currently presenting with clinical s/s of dysphagia as per this bedside/clinical swallow evaluation. He is currently one day post-op after C3-4 ACDF. He reports discomfort when swallowing and when turning head but not to the extent he is unable to perform these actions. His voice was strong, clear. SLP observed patient with straw sips of thin liquids (water) and he did not exhibit any overt s/s aspiration or penetration and swallow initiation appeared timely. SLP educated patient on easing back into swallowing foods (liquids, foods that were more moist, etc). As patient appears to be experiencing mild and expected temporary dysphagia symptoms, SLP not recommending any further assessment or treatment of his swallow function at this time. ?SLP Visit Diagnosis: Dysphagia, unspecified (R13.10) ?   ?Aspiration Risk ? No  limitations  ?  ?Diet Recommendation Regular;Thin liquid  ? ?Liquid Administration via: Cup;Straw ?Medication Administration: Whole meds with liquid ?Supervision: Patient able to self feed ?Postural Changes: Seated upright at 90 degrees  ?  ?Other  Recommendations Oral Care Recommendations: Oral care BID;Patient independent with oral care   ? ?Recommendations for follow up therapy are one component of a multi-disciplinary discharge planning process, led by the attending physician.  Recommendations may be updated based on patient status, additional functional criteria and insurance authorization. ? ?Follow up Recommendations No SLP follow up  ? ? ?  ?Assistance Recommended at Discharge None  ?Functional Status Assessment Patient has had a recent decline in their functional status and demonstrates the ability to make significant improvements in function in a reasonable and predictable amount of time.  ?Frequency and Duration   N/A ?  ?  ?   ? ?Prognosis N/A  ? ?Swallow Study   ?General Date of Onset: 04/30/21 ?HPI: Patient is a 71 y.o. male with PMH: HTN, prior ACDF who presented to the ED on 04/28/21 following left sided arm and leg weakness after fall 3 days prior and another more recent fall 1 day ago. CDT spine showed previous surgical fusion at C4-5 and C5-6 and showing spinal stenosis at C3-4 and C6-7. Patient underwent ACDF of C3-4 on 3/25. ?Type of Study: Bedside Swallow Evaluation ?Previous Swallow Assessment: none found ?Diet Prior to this Study: Regular;Thin liquids ?Temperature Spikes Noted: No ?Respiratory Status: Room air ?History of Recent Intubation: No ?Behavior/Cognition: Alert;Pleasant mood;Cooperative ?Oral Cavity Assessment:  Within Functional Limits ?Oral Care Completed by SLP: No ?Oral Cavity - Dentition: Adequate natural dentition ?Vision: Functional for self-feeding ?Self-Feeding Abilities: Able to feed self ?Patient Positioning: Upright in bed ?Baseline Vocal Quality: Normal ?Volitional Cough:  Strong ?Volitional Swallow: Able to elicit  ?  ?Oral/Motor/Sensory Function Overall Oral Motor/Sensory Function: Within functional limits   ?Ice Chips     ?Thin Liquid Thin Liquid: Within functional limits ?Presentation: Straw;Self Fed  ?  ?Nectar Thick     ?Honey Thick     ?Puree Puree: Not tested   ?Solid ? ? ?  Solid: Not tested  ? ?  ?Sonia Baller, MA, CCC-SLP ?Speech Therapy ? ? ? ? ?

## 2021-05-01 NOTE — Progress Notes (Signed)
?  NEUROSURGERY PROGRESS NOTE  ? ?No issues overnight. Pt reports mild dysphagia but tolerating regular diet. Mild appropriate neck soreness. ? ?EXAM:  ?BP (!) 157/76 (BP Location: Left Arm)   Pulse (!) 105   Temp 97.7 ?F (36.5 ?C) (Oral)   Resp 16   Ht 5\' 6"  (1.676 m)   Wt 81.9 kg   SpO2 94%   BMI 29.14 kg/m?  ? ?Awake, alert, oriented  ?Speech fluent, appropriate  ?CN grossly intact  ?5/5 RUE ?Mild diffuse RUE/RLE weakness  ? ?IMPRESSION:  ?71 y.o. male POD#1 s/p C3-4 ACDF doing well ? ?PLAN: ?- Cont therapy ?- May be CIR candidate ?- Can f/u in outpatient clinic in 3 weeks for routine f/u ? ? ?59, MD ?[facility]  ? ?

## 2021-05-01 NOTE — Progress Notes (Signed)
?PROGRESS NOTE ? ? ? ?EDIEL UNANGST  QMG:[id number] DOB: [birth year] DOA: 04/28/2021 ?PCP: Pcp, No  ? ? ? ?Brief Narrative:  ?Gabriel Stein is a 71 y.o. male with medical history significant for hypertension, hyperlipidemia, spinal stenosis of the cervical spine status post cervical fusion C4-C5 as well as C5-C6, spinal stenosis of the lumbar spine status post L4-L5 fusion, presents to [facility] ED complaining of weakness.  ? ?Subjective:   Patient in bed, appears comfortable, denies any headache, no fever, no chest pain or pressure, no shortness of breath , no abdominal pain. No new focal weakness. ? ?Assessment and Plan: ? 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 Conchita Paris, continue supportive care PT OT postsurgery.  Stable likely DC with home health PT on 05/02/2021. ?  ? ? ?Leukocytosis - due to recent exposure of steroids for 5 days prior to surgery, along with stress of surgery, no signs of infection. ? ?Hyponatremia   from pain? Hold chlorthalidone , Improved,  ?  ?AKI.  AKI resolved after hydration. ? ?HTN -  Stable, continue zebeta, reintroduce low-dose lisinopril with as needed hydralazine and monitor. ? ?HLD - Continue statin ? ?GERD (gastroesophageal reflux disease)   continue home PPI.  ?  ?  ? ?DVT prophylaxis: SCDs Start: 04/28/21 1952 ? ? ?Code Status:   Code Status: Full Code ? ?Family Communication: patient ?Disposition:  ? ?Status is: Inpatient ? ? ? ?Dispo: The patient is from: home ?             Anticipated d/c is to: home ?             Anticipated d/c date is: TBD ? ?Antimicrobials:   ? ?Anti-infectives (From admission, onward)  ? ? None  ? ?  ? ? ?Objective: ?Vitals:  ? 04/30/21 2332 05/01/21 3267 05/01/21 0321 05/01/21 1245  ?BP: 129/68  (!) 104/54 (!) 157/76  ?Pulse: 92  76 (!) 105  ?Resp: 18  20 16   ?Temp: 98.2 ?F (36.8 ?C)  97.8 ?F (36.6 ?C) 97.7 ?F (36.5 ?C)  ?TempSrc:  Oral  Oral Oral  ?SpO2: 96%  92% 94%  ?Weight:  81.9 kg    ?Height:      ? ? ?Intake/Output Summary (Last 24 hours) at 05/01/2021 0910 ?Last data filed at 05/01/2021 0827 ?Gross per 24 hour  ?Intake 2064.92 ml  ?Output 3550 ml  ?Net -1485.08 ml  ? ?Filed Weights  ? 04/28/21 1123 04/30/21 0500 05/01/21 0227  ?Weight: 86.2 kg 86.1 kg 81.9 kg  ? ? ?Examination: ? ?Awake Alert, No new F.N deficits, Normal affect ?[place].AT,PERRAL ?Supple Neck, No JVD, L neck surgical scar site looks clean ?Symmetrical Chest wall movement, Good air movement bilaterally, CTAB ?RRR,No Gallops, Rubs or new Murmurs,  ?+ve B.Sounds, Abd Soft, No tenderness,   ?No Cyanosis, Clubbing or edema  ? ? ?Recent Labs  ?Lab 04/28/21 ?1208 04/29/21 ?0103 04/30/21 ?05/02/21 05/01/21 ?0225  ?WBC 13.5* 11.3* 8.9 13.2*  ?HGB 14.4 14.0 14.6 14.9  ?HCT 42.3 40.2 42.8 42.6  ?PLT 220 206 200 220  ?MCV 91.4 89.9 91.1 90.1  ?MCH 31.1 31.3 31.1 31.5  ?MCHC 34.0 34.8 34.1 35.0  ?RDW 12.8 12.8 13.1 12.7  ?LYMPHSABS  --  2.2  --  1.2  ?MONOABS  --  0.9  --  1.0  ?EOSABS  --  0.0  --  0.0  ?  BASOSABS  --  0.0  --  0.0  ? ? ?Recent Labs  ?Lab 04/28/21 ?1208 04/29/21 ?0103 04/30/21 ?1761 05/01/21 ?0225  ?NA 125* 130* 131* 131*  ?K 4.5 4.0 3.9 3.9  ?CL 94* 100 100 100  ?CO2 18* 22 23 25   ?GLUCOSE 138* 97 98 129*  ?BUN 23 22 23 19   ?CREATININE 1.35* 1.28* 1.35* 1.07  ?CALCIUM 9.5 9.3 9.2 9.3  ?AST 40 31  --  33  ?ALT 30 31  --  41  ?ALKPHOS 42 39  --  39  ?BILITOT 0.8 0.9  --  0.6  ?ALBUMIN 3.9 3.7  --  3.6  ?MG  --  1.9 1.8 1.8  ?TSH  --  2.072  --   --   ? ?  ?Data Reviewed: I have personally reviewed  labs and visualized  imaging studies since the last encounter and formulate the plan  ? ? ?Scheduled Meds: ? aspirin  325 mg Oral Daily  ? bisoprolol  10 mg Oral Daily  ? lisinopril  10 mg Oral Daily  ? pantoprazole  40 mg Oral Daily  ? simvastatin  20 mg Oral QPM  ? ?Continuous Infusions: ? ? ? ? LOS: 3 days  ? ?Signature ? ? M.D on 05/01/2021 at 9:10 AM   -  To page  go to [email or web address]  ? ?  ? ? ? ?

## 2021-05-01 NOTE — Progress Notes (Signed)
IP rehab admissions - per therapy recommendations, patient screened for potential acute inpatient rehab admission.  Appears to be a potential candidate for CIR.  I will place a rehab consult order per protocol and for a full assessment.  Call for questions.  [phone] ?

## 2021-05-01 NOTE — Progress Notes (Signed)
Physical Therapy Treatment ?Patient Details ?Name: Gabriel Stein ?MRN: [id number] ?DOB: 03/27/1950 ?Today's Date: 05/01/2021 ? ? ?History of Present Illness 71 y/o male presented to ED on 04/28/21 following L sided arm and leg weakness after fall x 3 days ago and recent fall x1 day ago. CT spine showed previous surgical fusion at C4-5 and C5-6 while showing spinal stenosis at C3-4 and C6-7 levels. S/p ACDF C3-4 on 3/25. PMH: HTN, hx of ACDF. ? ?  ?PT Comments  ? ? Continuing work on functional mobility and activity tolerance;  Session focused on more detailed balance assessment to assess for fall risk in the setting of previous falls and ACDF surgery;  ? ? ? 05/01/21 1700  ?Standardized Balance Assessment  ?Standardized Balance Assessment  Berg Balance Test  ?Berg Balance Test  ?Sit to Stand 3  ?Standing Unsupported 3  ?Sitting with Back Unsupported but Feet Supported on Floor or Stool 4  ?Stand to Sit 4  ?Transfers 3  ?Standing Unsupported with Eyes Closed 3  ?Standing Ubsupported with Feet Together 2  ?From Standing, Reach Forward with Outstretched Arm 3  ?From Standing Position, Pick up Object from Floor 2  ?From Standing Position, Turn to Look Behind Over each Shoulder 3  ?Turn 360 Degrees 2  ?Standing Unsupported, Alternately Place Feet on Step/Stool 2  ?Standing Unsupported, One Foot in Front 2  ?Standing on One Leg 1  ?Total Score 37  ? ?Berg score of 37/56 is indicative of significant fall risk (>80%); he is aware of fall risk and is motivated to improve; Continue to recommend acute inpatient rehab (AIR) for post-acute therapy needs. ?   ?Recommendations for follow up therapy are one component of a multi-disciplinary discharge planning process, led by the attending physician.  Recommendations may be updated based on patient status, additional functional criteria and insurance authorization. ? ?Follow Up Recommendations ? Acute inpatient rehab (3hours/day) ?  ?  ?Assistance Recommended at Discharge  Intermittent Supervision/Assistance  ?Patient can return home with the following A little help with walking and/or transfers;A little help with bathing/dressing/bathroom;Assistance with cooking/housework;Assist for transportation;Help with stairs or ramp for entrance ?  ?Equipment Recommendations ? Rolling walker (2 wheels)  ?  ?Recommendations for Other Services Rehab consult ? ? ?  ?Precautions / Restrictions Precautions ?Precautions: Fall;Cervical ?Precaution Comments: reviewed cervical precautions ?Required Braces or Orthoses: Cervical Brace ?Cervical Brace: Soft collar (notified RN that patient would like soft collar for comfort)  ?  ? ?Mobility ? Bed Mobility ?  ?  ?  ?  ?  ?  ?  ?  ?  ? ?Transfers ?Overall transfer level: Needs assistance ?Equipment used: None ?Transfers: Sit to/from Stand ?Sit to Stand: Min assist ?Stand pivot transfers: Min guard ?Step pivot transfers: Min guard ?  ?  ?  ?General transfer comment: minA to steady upon standing and patient reaching for objects to hold onto ?  ? ?Ambulation/Gait ?  ?  ?  ?  ?  ?  ?  ?  ? ? ?Stairs ?  ?  ?  ?  ?  ? ? ?Wheelchair Mobility ?  ? ?Modified Rankin (Stroke Patients Only) ?  ? ? ?  ?Balance   ?  ?Sitting balance-Leahy Scale: Good ?  ?  ?Standing balance support: During functional activity, No upper extremity supported ?Standing balance-Leahy Scale: Poor ?  ?  ?  ?  ?  ?  ?  ?  ?  ?Standardized Balance Assessment ?Standardized Balance Assessment : Merrilee Jansky  Balance Test ?Berg Balance Test ?Sit to Stand: Able to stand  independently using hands ?Standing Unsupported: Able to stand 2 minutes with supervision ?Sitting with Back Unsupported but Feet Supported on Floor or Stool: Able to sit safely and securely 2 minutes ?Stand to Sit: Sits safely with minimal use of hands ?Transfers: Able to transfer safely, definite need of hands ?Standing Unsupported with Eyes Closed: Able to stand 10 seconds with supervision ?Standing Ubsupported with Feet Together: Able to  place feet together independently but unable to hold for 30 seconds ?From Standing, Reach Forward with Outstretched Arm: Can reach forward >12 cm safely (5") ?From Standing Position, Pick up Object from Floor: Unable to pick up shoe, but reaches 2-5 cm (1-2") from shoe and balances independently ?From Standing Position, Turn to Look Behind Over each Shoulder: Looks behind one side only/other side shows less weight shift ?Turn 360 Degrees: Able to turn 360 degrees safely but slowly ?Standing Unsupported, Alternately Place Feet on Step/Stool: Able to complete 4 steps without aid or supervision ?Standing Unsupported, One Foot in Front: Able to take small step independently and hold 30 seconds ?Standing on One Leg: Tries to lift leg/unable to hold 3 seconds but remains standing independently ?Total Score: 37 ?  ?  ? ?  ?Cognition Arousal/Alertness: Awake/alert ?Behavior During Therapy: [facility] for tasks assessed/performed ?Overall Cognitive Status: Within Functional Limits for tasks assessed ?  ?  ?  ?  ?  ?  ?  ?  ?  ?  ?  ?  ?  ?  ?  ?  ?  ?  ?  ? ?  ?Exercises   ? ?  ?General Comments General comments (skin integrity, edema, etc.): VSS on RA ?  ?  ? ?Pertinent Vitals/Pain Pain Assessment ?Pain Assessment: Faces ?Faces Pain Scale: Hurts little more ?Pain Location: neck ?Pain Descriptors / Indicators: Discomfort ?Pain Intervention(s): Monitored during session  ? ? ?Home Living   ?  ?  ?  ?  ?  ?  ?  ?  ?  ?   ?  ?Prior Function    ?  ?  ?   ? ?PT Goals (current goals can now be found in the care plan section) Acute Rehab PT Goals ?Patient Stated Goal: to be independent ?PT Goal Formulation: With patient ?Time For Goal Achievement: 05/14/21 ?Potential to Achieve Goals: Good ?Progress towards PT goals: Progressing toward goals ? ?  ?Frequency ? ? ? Min 5X/week ? ? ? ?  ?PT Plan Current plan remains appropriate  ? ? ?Co-evaluation   ?  ?  ?  ?  ? ?  ?AM-PAC PT "6 Clicks" Mobility   ?Outcome Measure ? Help needed turning from  your back to your side while in a flat bed without using bedrails?: None ?Help needed moving from lying on your back to sitting on the side of a flat bed without using bedrails?: A Little ?Help needed moving to and from a bed to a chair (including a wheelchair)?: A Little ?Help needed standing up from a chair using your arms (e.g., wheelchair or bedside chair)?: A Little ?Help needed to walk in hospital room?: A Lot ?Help needed climbing 3-5 steps with a railing? : A Lot ?6 Click Score: 17 ? ?  ?End of Session Equipment Utilized During Treatment: Gait belt ?Activity Tolerance: Patient tolerated treatment well ?Patient left: in bed;with call bell/phone within reach ?Nurse Communication: Mobility status ?PT Visit Diagnosis: Unsteadiness on feet (R26.81);Muscle weakness (generalized) (M62.81);History  of falling (Z91.81);Ataxic gait (R26.0);Difficulty in walking, not elsewhere classified (R26.2) ?  ? ? ?Time: [id number] ?PT Time Calculation (min) (ACUTE ONLY): 25 min ? ?Charges:  $Therapeutic Activity: 23-37 mins          ?          ? ?Roney Marion, PT  ?Acute Rehabilitation Services ?Pager [phone] ?Office [phone] ? ? ? ?Colletta [place] ?05/01/2021, 5:47 PM ? ?

## 2021-05-02 ENCOUNTER — Other Ambulatory Visit (HOSPITAL_COMMUNITY): Payer: Self-pay

## 2021-05-02 DIAGNOSIS — E871 Hypo-osmolality and hyponatremia: Secondary | ICD-10-CM | POA: Diagnosis not present

## 2021-05-02 LAB — COMPREHENSIVE METABOLIC PANEL
ALT: 36 U/L (ref 0–44)
AST: 27 U/L (ref 15–41)
Albumin: 3.6 g/dL (ref 3.5–5.0)
Alkaline Phosphatase: 37 U/L — ABNORMAL LOW (ref 38–126)
Anion gap: 9 (ref 5–15)
BUN: 21 mg/dL (ref 8–23)
CO2: 25 mmol/L (ref 22–32)
Calcium: 9.2 mg/dL (ref 8.9–10.3)
Chloride: 100 mmol/L (ref 98–111)
Creatinine, Ser: 1.2 mg/dL (ref 0.61–1.24)
GFR, Estimated: >60 mL/min (ref >60)
Glucose, Bld: 106 mg/dL — ABNORMAL HIGH (ref 70–99)
Potassium: 3.6 mmol/L (ref 3.5–5.1)
Sodium: 134 mmol/L — ABNORMAL LOW (ref 135–145)
Total Bilirubin: 0.8 mg/dL (ref 0.3–1.2)
Total Protein: 5.9 g/dL — ABNORMAL LOW (ref 6.5–8.1)

## 2021-05-02 LAB — CBC WITH DIFFERENTIAL/PLATELET
Abs Immature Granulocytes: 0.06 10*3/uL (ref 0.00–0.07)
Basophils Absolute: 0 10*3/uL (ref 0.0–0.1)
Basophils Relative: 0 %
Eosinophils Absolute: 0.1 10*3/uL (ref 0.0–0.5)
Eosinophils Relative: 1 %
HCT: 40.9 % (ref 39.0–52.0)
Hemoglobin: 14.3 g/dL (ref 13.0–17.0)
Immature Granulocytes: 1 %
Lymphocytes Relative: 22 %
Lymphs Abs: 2.7 10*3/uL (ref 0.7–4.0)
MCH: 31.4 pg (ref 26.0–34.0)
MCHC: 35 g/dL (ref 30.0–36.0)
MCV: 89.9 fL (ref 80.0–100.0)
Monocytes Absolute: 1.1 10*3/uL — ABNORMAL HIGH (ref 0.1–1.0)
Monocytes Relative: 9 %
Neutro Abs: 8.1 10*3/uL — ABNORMAL HIGH (ref 1.7–7.7)
Neutrophils Relative %: 67 %
Platelets: 186 10*3/uL (ref 150–400)
RBC: 4.55 MIL/uL (ref 4.22–5.81)
RDW: 13 % (ref 11.5–15.5)
WBC: 12.2 10*3/uL — ABNORMAL HIGH (ref 4.0–10.5)
nRBC: 0 % (ref 0.0–0.2)

## 2021-05-02 LAB — MAGNESIUM: Magnesium: 1.7 mg/dL (ref 1.7–2.4)

## 2021-05-02 MED ORDER — TRAMADOL HCL 50 MG PO TABS
50.0000 mg | ORAL_TABLET | Freq: Four times a day (QID) | ORAL | 0 refills | Status: AC | PRN
  Filled 2021-05-02: qty 10, 3d supply, fill #0

## 2021-05-02 MED ORDER — ACETAMINOPHEN 500 MG PO TABS
500.0000 mg | ORAL_TABLET | Freq: Three times a day (TID) | ORAL | 0 refills | Status: AC | PRN
  Filled 2021-05-02: qty 20, 7d supply, fill #0

## 2021-05-02 NOTE — Progress Notes (Signed)
Occupational Therapy Treatment ?Patient Details ?Name: Gabriel Stein ?MRN: [id number] ?DOB: Nov 29, 1950 ?Today's Date: 05/02/2021 ? ? ?History of present illness 71 y/o male presented to ED on 04/28/21 following L sided arm and leg weakness after fall x 3 days ago and recent fall x1 day ago. CT spine showed previous surgical fusion at C4-5 and C5-6 while showing spinal stenosis at C3-4 and C6-7 levels. S/p ACDF C3-4 on 3/25. PMH: HTN, hx of ACDF. ?  ?OT comments ? Pt progressing towards acute OT goals. Pt sitting in visitor's chair, dressed and awaiting d/c home at start of session. Pt remains to require rw for functional mobility and transfers and verbalizes/demonstrates understanding of this plan. Pt completed household distance mobility. Discussed safety with tub transfers. Also discussed energy conservation strategies for managing ADLs at home. D/c recommendation remains appropriate.   ? ?Recommendations for follow up therapy are one component of a multi-disciplinary discharge planning process, led by the attending physician.  Recommendations may be updated based on patient status, additional functional criteria and insurance authorization. ?   ?Follow Up Recommendations ? Home health OT  ?  ?Assistance Recommended at Discharge PRN  ?Patient can return home with the following ? Assistance with cooking/housework;Assist for transportation ?  ?Equipment Recommendations ? Other (comment) (rolling walker)  ?  ?Recommendations for Other Services   ? ?  ?Precautions / Restrictions Precautions ?Precautions: Fall;Cervical ?Required Braces or Orthoses: Cervical Brace ?Cervical Brace: Soft collar;For comfort ?Restrictions ?Weight Bearing Restrictions: No  ? ? ?  ? ?Mobility Bed Mobility ?Overal bed mobility: Needs Assistance ?  ?  ?  ?  ?  ?  ?General bed mobility comments: up in chair ?  ? ?Transfers ?Overall transfer level: Needs assistance ?Equipment used: Rolling walker (2 wheels) ?Transfers: Sit to/from Stand ?Sit to  Stand: Min guard, Supervision ?  ?  ?  ?  ?  ?General transfer comment: to/from visitor's chair. utilized rw. ?  ?  ?Balance Overall balance assessment: Needs assistance ?Sitting-balance support: No upper extremity supported ?  ?  ?  ?Standing balance support: During functional activity, Bilateral upper extremity supported, Reliant on assistive device for balance ?Standing balance-Leahy Scale: Poor ?Standing balance comment: needs external support ?  ?  ?  ?  ?  ?  ?  ?  ?  ?  ?  ?   ? ?ADL either performed or assessed with clinical judgement  ? ?ADL Overall ADL's : Needs assistance/impaired ?  ?  ?  ?  ?  ?  ?  ?  ?  ?  ?  ?  ?Toilet Transfer: Min guard;Rolling walker (2 wheels) ?  ?  ?  ?  ?  ?Functional mobility during ADLs: Min guard;Rolling walker (2 wheels) ?General ADL Comments: Completed household distance functional mobility. Pt already dressed for d/c at start of session. Discussed safety with tub transfer. Pt plans to obtain shower chair. Also discussed energy conservation strategies for managing ADLs. Pt with good awareness of deficits and safety. ?  ? ?Extremity/Trunk Assessment Upper Extremity Assessment ?Upper Extremity Assessment: LUE deficits/detail ?LUE Deficits / Details: shoulder flexion 0-110 degrees, shoulder strength 3+5 as well as elbow flexion/extension and grip.  Slower finger to nose time compared to the RUE as well. ?LUE Coordination: decreased gross motor;decreased fine motor ?  ?Lower Extremity Assessment ?Lower Extremity Assessment: Defer to PT evaluation ?  ?  ?  ? ?Vision   ?  ?  ?Perception   ?  ?Praxis   ?  ? ?  Cognition Arousal/Alertness: Awake/alert ?Behavior During Therapy: [facility] for tasks assessed/performed ?Overall Cognitive Status: Within Functional Limits for tasks assessed ?  ?  ?  ?  ?  ?  ?  ?  ?  ?  ?  ?  ?  ?  ?  ?  ?  ?  ?  ?   ?Exercises   ? ?  ?Shoulder Instructions   ? ? ?  ?General Comments    ? ? ?Pertinent Vitals/ Pain       Pain Assessment ?Pain Assessment:  Faces ?Faces Pain Scale: Hurts a little bit ?Pain Location: neck ?Pain Descriptors / Indicators: Discomfort ?Pain Intervention(s): Monitored during session ? ?Home Living   ?  ?  ?  ?  ?  ?  ?  ?  ?  ?  ?  ?  ?  ?  ?  ?  ?  ?  ? ?  ?Prior Functioning/Environment    ?  ?  ?  ?   ? ?Frequency ? Min 2X/week  ? ? ? ? ?  ?Progress Toward Goals ? ?OT Goals(current goals can now be found in the care plan section) ? Progress towards OT goals: Progressing toward goals ? ?Acute Rehab OT Goals ?Patient Stated Goal: Get his strength back in the left side and go back home ?OT Goal Formulation: With patient ?Time For Goal Achievement: 05/13/21 ?Potential to Achieve Goals: Good ?ADL Goals ?Pt Will Perform Grooming: with modified independence;standing ?Pt Will Perform Upper Body Bathing: with modified independence;sitting ?Pt Will Perform Lower Body Bathing: with modified independence;sit to/from stand ?Pt Will Perform Upper Body Dressing: with modified independence;sitting ?Pt Will Perform Lower Body Dressing: with modified independence;sit to/from stand ?Pt Will Transfer to Toilet: with modified independence;ambulating ?Pt Will Perform Toileting - Clothing Manipulation and hygiene: with modified independence;sit to/from stand ?Pt Will Perform Tub/Shower Transfer: Tub transfer;ambulating;tub bench;with supervision ?Pt/caregiver will Perform Home Exercise Program: Increased strength;Left upper extremity;With theraband;With written HEP provided;Independently;With theraputty  ?Plan Discharge plan remains appropriate   ? ?Co-evaluation ? ? ?   ?  ?  ?  ?  ? ?  ?AM-PAC OT "6 Clicks" Daily Activity     ?Outcome Measure ? ? Help from another person eating meals?: None ?Help from another person taking care of personal grooming?: None ?Help from another person toileting, which includes using toliet, bedpan, or urinal?: A Little ?Help from another person bathing (including washing, rinsing, drying)?: A Little ?Help from another person to  put on and taking off regular upper body clothing?: None ?Help from another person to put on and taking off regular lower body clothing?: A Little ?6 Click Score: 21 ? ?  ?End of Session Equipment Utilized During Treatment: Rolling walker (2 wheels) ? ?OT Visit Diagnosis: Unsteadiness on feet (R26.81);Repeated falls (R29.6);Muscle weakness (generalized) (M62.81);Other (comment) ?  ?Activity Tolerance Patient tolerated treatment well ?  ?Patient Left in chair;with call bell/phone within reach ?  ?Nurse Communication   ?  ? ?   ? ?Time: 8295-6213 ?OT Time Calculation (min): 11 min ? ?Charges: OT General Charges ?$OT Visit: 1 Visit ?OT Treatments ?$Self Care/Home Management : 8-22 mins ? ?Raynald Kemp, OT ?Acute Rehabilitation Services ?Office: [phone] ? ? ?Raynald Kemp H ?05/02/2021, 10:24 AM ?

## 2021-05-02 NOTE — Discharge Instructions (Signed)
Follow with Primary MD Street, Stephanie Coup, MD in 7 days  ? ?Get CBC, CMP, 2 view Chest X ray -  checked next visit within 1 week by Primary MD   ? ?Activity: As tolerated with Full fall precautions use walker/cane & assistance as needed ? ?Disposition Home  ? ?Diet: Heart Healthy  ? ?Special Instructions: If you have smoked or chewed Tobacco  in the last 2 yrs please stop smoking, stop any regular Alcohol  and or any Recreational drug use. ? ?On your next visit with your primary care physician please Get Medicines reviewed and adjusted. ? ?Please request your Prim.MD to go over all Hospital Tests and Procedure/Radiological results at the follow up, please get all Hospital records sent to your Prim MD by signing hospital release before you go home. ? ?If you experience worsening of your admission symptoms, develop shortness of breath, life threatening emergency, suicidal or homicidal thoughts you must seek medical attention immediately by calling 911 or calling your MD immediately  if symptoms less severe. ? ?You Must read complete instructions/literature along with all the possible adverse reactions/side effects for all the Medicines you take and that have been prescribed to you. Take any new Medicines after you have completely understood and accpet all the possible adverse reactions/side effects.  ? ?  ?

## 2021-05-02 NOTE — TOC Transition Note (Signed)
Transition of Care (TOC) - CM/SW Discharge Note ? ? ?Patient Details  ?Name: Gabriel Stein ?MRN: [id number] ?Date of Birth: July 23, 1950 ? ?Transition of Care (TOC) CM/SW Contact:  ?Cyndi Bender, RN ?Phone Number: ?05/02/2021, 10:29 AM ? ? ?Clinical Narrative:    ?Orders for Home Health and DME. ?Spoke to patient regarding transition needs. ?Choice offered an patient defers to [facility] to find [facility] agency.  ?Spoke to [place] with [facility] and referral accepted for HH-PT/OT ?Patient is agreeable to use in house provider, [facility], for DME needs.  ?Spoke to [place] with [facility] and Gilford Rile will be delivered to room prior to discharge. ?Son in law will transport home. ? ?Address, phone number and PCP confirmed.  ? ? ? ?Final next level of care: [facility] ?Barriers to Discharge: Barriers Resolved ? ? ?Patient Goals and CMS Choice ?Patient states their goals for this hospitalization and ongoing recovery are:: go home ?CMS Medicare.gov Compare Post Acute Care list provided to:: Patient ?Choice offered to / list presented to : Patient ? ?Discharge Placement ?  ?           ?  ? home ?  ?  ? ?Discharge Plan and Services ?  ?  ?           ?DME Arranged: Walker rolling ?DME Agency: [facility] ?Date DME Agency Contacted: 05/02/21 ?Time DME Agency Contacted: [phone] ?Representative spoke with at DME Agency: Delana Meyer ?HH Arranged: PT, OT ?[place] Agency: [facility] ?Date HH Agency Contacted: 05/02/21 ?Time [place]: [phone] ?Representative spoke with at [place]: Tommi Rumps ? ?Social Determinants of Health (SDOH) Interventions ?  ? ? ?Readmission Risk Interventions ? ?  05/02/2021  ? 10:28 AM  ?Readmission Risk Prevention Plan  ?Post Dischage Appt Complete  ?Medication Screening Complete  ?Transportation Screening Complete  ? ? ? ? ? ?

## 2021-05-02 NOTE — Care Management Important Message (Signed)
Important Message ? ?Patient Details  ?Name: Gabriel Stein ?MRN: [id number] ?Date of Birth: Sep 21, 1950 ? ? ?Medicare Important Message Given:  Yes ? ?Patient left prior to IM delivery will mail IM to the patient home address.  ? ? ?[name] ?05/02/2021, 3:50 PM ?

## 2021-05-02 NOTE — Discharge Summary (Signed)
?                                                                                ? Gabriel Stein OXB:[id number] DOB: [birth year] DOA: 04/28/2021 ? ?PCP: Street, Stephanie Coup, MD ? ?Admit date: 04/28/2021  Discharge date: 05/02/2021 ? ?Admitted From: Home   Disposition:  Home ? ? ?Recommendations for Outpatient Follow-up:  ? ?Follow up with PCP in 1-2 weeks ? ?PCP Please obtain BMP/CBC, 2 view CXR in 1week,  (see Discharge instructions)  ? ?PCP Please follow up on the following pending results:  ? ? ?Home Health: PT-OT   ?Equipment/Devices: as below  ?Consultations: N.Surg ?Discharge Condition: Stable    ?CODE STATUS: Full    ?Diet Recommendation: Heart Healthy  ? ? ?Chief Complaint  ?Patient presents with  ? Weakness  ?  ? ?Brief history of present illness from the day of admission and additional interim summary   ? ?71 y.o. male with medical history significant for hypertension, hyperlipidemia, spinal stenosis of the cervical spine status post cervical fusion C4-C5 as well as C5-C6, spinal stenosis of the lumbar spine status post L4-L5 fusion, presents to [facility] ED complaining of weakness.  ? ?                                                               Hospital Course  ? 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 Conchita Paris, continue supportive care PT OT postsurgery.  Stable and relatively symptom-free now will DC with home health PT on 05/02/2021.  He refused CIR. ?  ? ?Leukocytosis - due to recent exposure of steroids for 5 days prior to surgery, along with stress of surgery, no signs of infection. ?  ?Hyponatremia   from pain? Hold chlorthalidone , Improved, PCP to recheck CBC along with CMP in 7 to 10 days. ?  ?AKI.  AKI resolved after hydration. ?  ?HTN -  Stable, continue zebeta, reintroduce low-dose  lisinopril with as needed hydralazine and monitor. ?  ?HLD - Continue statin ?  ?GERD (gastroesophageal reflux disease)   continue home PPI.  ? ? ?Discharge diagnosis   ? ? ?Principal Problem: ?  Acute hyponatremia ?Active Problems: ?  Generalized weakness ?  Elevated serum creatinine ?  Leukocytosis ?  Hypertension ?  Hypercholesteremia ?  GERD (gastroesophageal reflux disease) ? ? ? ?Discharge instructions   ? ?Discharge Instructions   ? ? Diet - low sodium heart healthy   Complete by: As directed ?  ? Discharge instructions   Complete by: As directed ?  ? Follow with Primary MD Street, Stephanie Coup, MD in 7 days  ? ?Get CBC, CMP, 2 view Chest X ray -  checked next visit within 1 week by Primary MD   ? ?Activity: As tolerated with Full fall precautions use walker/cane & assistance as needed ? ?  Disposition Home  ? ?Diet: Heart Healthy  ? ?Special Instructions: If you have smoked or chewed Tobacco  in the last 2 yrs please stop smoking, stop any regular Alcohol  and or any Recreational drug use. ? ?On your next visit with your primary care physician please Get Medicines reviewed and adjusted. ? ?Please request your Prim.MD to go over all Hospital Tests and Procedure/Radiological results at the follow up, please get all Hospital records sent to your Prim MD by signing hospital release before you go home. ? ?If you experience worsening of your admission symptoms, develop shortness of breath, life threatening emergency, suicidal or homicidal thoughts you must seek medical attention immediately by calling 911 or calling your MD immediately  if symptoms less severe. ? ?You Must read complete instructions/literature along with all the possible adverse reactions/side effects for all the Medicines you take and that have been prescribed to you. Take any new Medicines after you have completely understood and accpet all the possible adverse reactions/side effects.  ? Discharge wound care:   Complete by: As directed ?  ? Keep  your neck surgical site clean and dry at all times  ? Increase activity slowly   Complete by: As directed ?  ? ?  ? ? ?Discharge Medications  ? ?Allergies as of 05/02/2021   ? ?   Reactions  ? Clarithromycin Hives, Rash, Other (See Comments)  ? Welts, also  ? Penicillins Hives, Rash  ? Welts, also  ? Gabapentin Other (See Comments)  ? Drowsiness and urinary incontinence  ? Sulfa Antibiotics Other (See Comments)  ? Patient said his MD told him he was allergic, but the exact reaction was not recalled  ? Venlafaxine Other (See Comments)  ? Reaction not recalled   ? ?  ? ?  ?Medication List  ?  ? ?STOP taking these medications   ? ?predniSONE 20 MG tablet ?Commonly known as: DELTASONE ?  ? ?  ? ?TAKE these medications   ? ?acetaminophen 500 MG tablet ?Commonly known as: TYLENOL ?Take 1 tablet (500 mg total) by mouth every 8 (eight) hours as needed for moderate pain. ?  ?albuterol 108 (90 Base) MCG/ACT inhaler ?Commonly known as: VENTOLIN HFA ?Inhale 2 puffs into the lungs 3 (three) times daily. ?  ?aspirin 325 MG EC tablet ?Take 325 mg by mouth in the morning. ?  ?bisoprolol 10 MG tablet ?Commonly known as: ZEBETA ?Take 10 mg by mouth in the morning. ?  ?chlorthalidone 25 MG tablet ?Commonly known as: HYGROTON ?Take 25 mg by mouth in the morning. ?  ?felodipine 5 MG 24 hr tablet ?Commonly known as: PLENDIL ?Take 5 mg by mouth in the morning. ?  ?lisinopril 40 MG tablet ?Commonly known as: ZESTRIL ?Take 40 mg by mouth in the morning. ?  ?omeprazole 40 MG capsule ?Commonly known as: PRILOSEC ?Take 40 mg by mouth 2 (two) times daily. ?  ?simvastatin 20 MG tablet ?Commonly known as: ZOCOR ?Take 20 mg by mouth every evening. ?  ?traMADol 50 MG tablet ?Commonly known as: ULTRAM ?Take 1 tablet (50 mg total) by mouth every 6 (six) hours as needed for severe pain (for pain). ?What changed: reasons to take this ?  ? ?  ? ?  ?  ? ? ?  ?Durable Medical Equipment  ?(From admission, onward)  ?  ? ? ?  ? ?  Start     Ordered  ?  05/02/21 0741  For home use only DME Walker rolling  Once       ?Comments: 5 wheel  ?Question Answer Comment  ?Walker: With 5 Inch Wheels   ?Patient needs a walker to treat with the following condition Weakness   ?  ? 05/02/21 0740  ? ?  ?  ? ?  ? ? ?  ?Discharge Care Instructions  ?(From admission, onward)  ?  ? ? ?  ? ?  Start     Ordered  ? 05/02/21 0000  Discharge wound care:       ?Comments: Keep your neck surgical site clean and dry at all times  ? 05/02/21 1017  ? ?  ?  ? ?  ? ? ? Follow-up Information   ? ? Lisbeth RenshawNundkumar, Neelesh, MD Follow up in 3 week(s).   ?Specialty: Neurosurgery ?Contact information: ?[address] ?[address] ?[place] [address] ?[phone] ? ? ?  ?  ? ? Street, Stephanie Couphristopher M, MD. Schedule an appointment as soon as possible for a visit in 1 week(s).   ?Specialty: Family Medicine ?Why: Hospital follow up ?Contact information: ?[address] ?[place] [address] ?[phone] ? ? ?  ?  ? ?  ?  ? ?  ? ? ?Major procedures and Radiology Reports - PLEASE review detailed and final reports thoroughly  -    ?  ?DG Cervical Spine 1 View ? ?Result Date: 04/30/2021 ?CLINICAL DATA:  Portable image 4 anterior cervical decompression and discectomy/fusion. EXAM: DG CERVICAL SPINE - 1 VIEW COMPARISON:  Cervical MRI, 04/29/2021. Cervical CT, 04/28/2021. Demonstrates FINDINGS: Single lateral portable view of the cervical spine an anterior fusion plate spanning V7-Q4C3-C4, well-seated screws and well-positioned intervertebral cage/spacer. There is also anterior fusion plate spanning O9-G2C5-C6. Mature bone graft material spans the C4-C5 and C5-C6 disc interspaces. IMPRESSION: Single lateral cervical spine portable image for anterior cervical decompression and fusion. Electronically Signed   By: Amie Portlandavid  Ormond M.D.   On: 04/30/2021 12:36  ? ?CT Head Wo Contrast ? ?Result Date: 04/28/2021 ?CLINICAL DATA:  Trauma, fall EXAM: CT HEAD WITHOUT CONTRAST TECHNIQUE: Contiguous axial images were obtained from the  base of the skull through the vertex without intravenous contrast. RADIATION DOSE REDUCTION: This exam was performed according to the departmental dose-optimization program which includes automated exposure con

## 2021-05-06 ENCOUNTER — Encounter (HOSPITAL_COMMUNITY): Payer: Self-pay | Admitting: Neurosurgery

## 2021-05-11 DIAGNOSIS — E871 Hypo-osmolality and hyponatremia: Secondary | ICD-10-CM | POA: Diagnosis not present

## 2021-05-11 DIAGNOSIS — D72829 Elevated white blood cell count, unspecified: Secondary | ICD-10-CM | POA: Diagnosis not present

## 2021-05-11 DIAGNOSIS — Z4789 Encounter for other orthopedic aftercare: Secondary | ICD-10-CM | POA: Diagnosis not present

## 2021-05-12 DIAGNOSIS — M4723 Other spondylosis with radiculopathy, cervicothoracic region: Secondary | ICD-10-CM | POA: Diagnosis not present

## 2021-05-12 DIAGNOSIS — M4726 Other spondylosis with radiculopathy, lumbar region: Secondary | ICD-10-CM | POA: Diagnosis not present

## 2021-05-12 DIAGNOSIS — E663 Overweight: Secondary | ICD-10-CM | POA: Diagnosis not present

## 2021-05-12 DIAGNOSIS — M4322 Fusion of spine, cervical region: Secondary | ICD-10-CM | POA: Diagnosis not present

## 2021-05-12 DIAGNOSIS — I10 Essential (primary) hypertension: Secondary | ICD-10-CM | POA: Diagnosis not present

## 2021-05-12 DIAGNOSIS — Z6829 Body mass index (BMI) 29.0-29.9, adult: Secondary | ICD-10-CM | POA: Diagnosis not present

## 2021-05-23 DIAGNOSIS — Z6828 Body mass index (BMI) 28.0-28.9, adult: Secondary | ICD-10-CM | POA: Diagnosis not present

## 2021-05-23 DIAGNOSIS — M4802 Spinal stenosis, cervical region: Secondary | ICD-10-CM | POA: Diagnosis not present

## 2021-07-07 DIAGNOSIS — M6281 Muscle weakness (generalized): Secondary | ICD-10-CM | POA: Diagnosis not present

## 2021-07-07 DIAGNOSIS — M4802 Spinal stenosis, cervical region: Secondary | ICD-10-CM | POA: Diagnosis not present

## 2021-07-07 DIAGNOSIS — R2681 Unsteadiness on feet: Secondary | ICD-10-CM | POA: Diagnosis not present

## 2021-07-12 DIAGNOSIS — M6281 Muscle weakness (generalized): Secondary | ICD-10-CM | POA: Diagnosis not present

## 2021-07-12 DIAGNOSIS — R2681 Unsteadiness on feet: Secondary | ICD-10-CM | POA: Diagnosis not present

## 2021-07-12 DIAGNOSIS — M4802 Spinal stenosis, cervical region: Secondary | ICD-10-CM | POA: Diagnosis not present

## 2021-07-14 DIAGNOSIS — M6281 Muscle weakness (generalized): Secondary | ICD-10-CM | POA: Diagnosis not present

## 2021-07-14 DIAGNOSIS — M4802 Spinal stenosis, cervical region: Secondary | ICD-10-CM | POA: Diagnosis not present

## 2021-07-14 DIAGNOSIS — R2681 Unsteadiness on feet: Secondary | ICD-10-CM | POA: Diagnosis not present

## 2021-09-03 DIAGNOSIS — H524 Presbyopia: Secondary | ICD-10-CM | POA: Diagnosis not present

## 2021-09-03 DIAGNOSIS — H52209 Unspecified astigmatism, unspecified eye: Secondary | ICD-10-CM | POA: Diagnosis not present

## 2021-09-03 DIAGNOSIS — H5203 Hypermetropia, bilateral: Secondary | ICD-10-CM | POA: Diagnosis not present

## 2021-09-21 DIAGNOSIS — Z6829 Body mass index (BMI) 29.0-29.9, adult: Secondary | ICD-10-CM | POA: Diagnosis not present

## 2021-09-21 DIAGNOSIS — R29818 Other symptoms and signs involving the nervous system: Secondary | ICD-10-CM | POA: Diagnosis not present

## 2021-11-24 DIAGNOSIS — Z23 Encounter for immunization: Secondary | ICD-10-CM | POA: Diagnosis not present

## 2022-01-13 DIAGNOSIS — H2513 Age-related nuclear cataract, bilateral: Secondary | ICD-10-CM | POA: Diagnosis not present

## 2022-02-01 DIAGNOSIS — Z01818 Encounter for other preprocedural examination: Secondary | ICD-10-CM | POA: Diagnosis not present

## 2022-02-01 DIAGNOSIS — H2511 Age-related nuclear cataract, right eye: Secondary | ICD-10-CM | POA: Diagnosis not present

## 2022-02-12 DIAGNOSIS — H2511 Age-related nuclear cataract, right eye: Secondary | ICD-10-CM | POA: Diagnosis not present

## 2022-02-14 DIAGNOSIS — H02135 Senile ectropion of left lower eyelid: Secondary | ICD-10-CM | POA: Diagnosis not present

## 2022-02-14 DIAGNOSIS — H02132 Senile ectropion of right lower eyelid: Secondary | ICD-10-CM | POA: Diagnosis not present

## 2022-02-14 DIAGNOSIS — H259 Unspecified age-related cataract: Secondary | ICD-10-CM | POA: Diagnosis not present

## 2022-02-14 DIAGNOSIS — H2511 Age-related nuclear cataract, right eye: Secondary | ICD-10-CM | POA: Diagnosis not present

## 2022-02-14 DIAGNOSIS — H40013 Open angle with borderline findings, low risk, bilateral: Secondary | ICD-10-CM | POA: Diagnosis not present

## 2022-03-13 DIAGNOSIS — K2270 Barrett's esophagus without dysplasia: Secondary | ICD-10-CM | POA: Diagnosis not present

## 2022-03-21 DIAGNOSIS — H259 Unspecified age-related cataract: Secondary | ICD-10-CM | POA: Diagnosis not present

## 2022-03-21 DIAGNOSIS — H40003 Preglaucoma, unspecified, bilateral: Secondary | ICD-10-CM | POA: Diagnosis not present

## 2022-03-21 DIAGNOSIS — H25812 Combined forms of age-related cataract, left eye: Secondary | ICD-10-CM | POA: Diagnosis not present

## 2022-03-21 DIAGNOSIS — H2512 Age-related nuclear cataract, left eye: Secondary | ICD-10-CM | POA: Diagnosis not present

## 2022-04-04 DIAGNOSIS — Z79899 Other long term (current) drug therapy: Secondary | ICD-10-CM | POA: Diagnosis not present

## 2022-04-04 DIAGNOSIS — Z0000 Encounter for general adult medical examination without abnormal findings: Secondary | ICD-10-CM | POA: Diagnosis not present

## 2022-04-04 DIAGNOSIS — D500 Iron deficiency anemia secondary to blood loss (chronic): Secondary | ICD-10-CM | POA: Diagnosis not present

## 2022-04-04 DIAGNOSIS — I700 Atherosclerosis of aorta: Secondary | ICD-10-CM | POA: Diagnosis not present

## 2022-04-04 DIAGNOSIS — I10 Essential (primary) hypertension: Secondary | ICD-10-CM | POA: Diagnosis not present

## 2022-04-04 DIAGNOSIS — Z23 Encounter for immunization: Secondary | ICD-10-CM | POA: Diagnosis not present

## 2022-04-04 DIAGNOSIS — E782 Mixed hyperlipidemia: Secondary | ICD-10-CM | POA: Diagnosis not present

## 2022-04-04 DIAGNOSIS — Z125 Encounter for screening for malignant neoplasm of prostate: Secondary | ICD-10-CM | POA: Diagnosis not present

## 2022-04-04 DIAGNOSIS — J418 Mixed simple and mucopurulent chronic bronchitis: Secondary | ICD-10-CM | POA: Diagnosis not present

## 2022-04-04 DIAGNOSIS — K2270 Barrett's esophagus without dysplasia: Secondary | ICD-10-CM | POA: Diagnosis not present

## 2022-05-29 DIAGNOSIS — M4723 Other spondylosis with radiculopathy, cervicothoracic region: Secondary | ICD-10-CM | POA: Diagnosis not present

## 2022-05-29 DIAGNOSIS — M4726 Other spondylosis with radiculopathy, lumbar region: Secondary | ICD-10-CM | POA: Diagnosis not present

## 2022-05-29 DIAGNOSIS — E669 Obesity, unspecified: Secondary | ICD-10-CM | POA: Diagnosis not present

## 2022-05-29 DIAGNOSIS — I10 Essential (primary) hypertension: Secondary | ICD-10-CM | POA: Diagnosis not present

## 2022-05-29 DIAGNOSIS — Z6829 Body mass index (BMI) 29.0-29.9, adult: Secondary | ICD-10-CM | POA: Diagnosis not present

## 2022-05-29 DIAGNOSIS — M4322 Fusion of spine, cervical region: Secondary | ICD-10-CM | POA: Diagnosis not present

## 2022-06-12 DIAGNOSIS — M5416 Radiculopathy, lumbar region: Secondary | ICD-10-CM | POA: Diagnosis not present

## 2022-06-26 ENCOUNTER — Other Ambulatory Visit: Payer: Self-pay

## 2022-06-26 NOTE — Telephone Encounter (Signed)
Opened in error

## 2022-07-26 DIAGNOSIS — Z6829 Body mass index (BMI) 29.0-29.9, adult: Secondary | ICD-10-CM | POA: Diagnosis not present

## 2022-07-26 DIAGNOSIS — M4726 Other spondylosis with radiculopathy, lumbar region: Secondary | ICD-10-CM | POA: Diagnosis not present

## 2022-08-03 DIAGNOSIS — M549 Dorsalgia, unspecified: Secondary | ICD-10-CM | POA: Diagnosis not present

## 2022-08-03 DIAGNOSIS — M48062 Spinal stenosis, lumbar region with neurogenic claudication: Secondary | ICD-10-CM | POA: Diagnosis not present

## 2022-08-03 DIAGNOSIS — M963 Postlaminectomy kyphosis: Secondary | ICD-10-CM | POA: Diagnosis not present

## 2022-08-03 DIAGNOSIS — M1611 Unilateral primary osteoarthritis, right hip: Secondary | ICD-10-CM | POA: Diagnosis not present

## 2022-08-03 DIAGNOSIS — M4316 Spondylolisthesis, lumbar region: Secondary | ICD-10-CM | POA: Diagnosis not present

## 2022-09-25 DIAGNOSIS — M25551 Pain in right hip: Secondary | ICD-10-CM | POA: Diagnosis not present

## 2022-09-25 DIAGNOSIS — M1611 Unilateral primary osteoarthritis, right hip: Secondary | ICD-10-CM | POA: Diagnosis not present

## 2022-10-24 DIAGNOSIS — M1611 Unilateral primary osteoarthritis, right hip: Secondary | ICD-10-CM | POA: Diagnosis not present

## 2022-11-02 DIAGNOSIS — Z01810 Encounter for preprocedural cardiovascular examination: Secondary | ICD-10-CM | POA: Diagnosis not present

## 2022-11-02 DIAGNOSIS — M1611 Unilateral primary osteoarthritis, right hip: Secondary | ICD-10-CM | POA: Diagnosis not present

## 2022-11-15 DIAGNOSIS — Z79899 Other long term (current) drug therapy: Secondary | ICD-10-CM | POA: Diagnosis not present

## 2022-11-15 DIAGNOSIS — Z87891 Personal history of nicotine dependence: Secondary | ICD-10-CM | POA: Diagnosis not present

## 2022-11-15 DIAGNOSIS — J449 Chronic obstructive pulmonary disease, unspecified: Secondary | ICD-10-CM | POA: Diagnosis not present

## 2022-11-15 DIAGNOSIS — M1611 Unilateral primary osteoarthritis, right hip: Secondary | ICD-10-CM | POA: Diagnosis not present

## 2022-11-29 DIAGNOSIS — Z96641 Presence of right artificial hip joint: Secondary | ICD-10-CM | POA: Diagnosis not present

## 2022-11-29 DIAGNOSIS — Z471 Aftercare following joint replacement surgery: Secondary | ICD-10-CM | POA: Diagnosis not present

## 2022-11-30 DIAGNOSIS — M25651 Stiffness of right hip, not elsewhere classified: Secondary | ICD-10-CM | POA: Diagnosis not present

## 2022-11-30 DIAGNOSIS — M25551 Pain in right hip: Secondary | ICD-10-CM | POA: Diagnosis not present

## 2022-11-30 DIAGNOSIS — R2689 Other abnormalities of gait and mobility: Secondary | ICD-10-CM | POA: Diagnosis not present

## 2022-11-30 DIAGNOSIS — Z96641 Presence of right artificial hip joint: Secondary | ICD-10-CM | POA: Diagnosis not present

## 2022-12-06 DIAGNOSIS — M25551 Pain in right hip: Secondary | ICD-10-CM | POA: Diagnosis not present

## 2022-12-06 DIAGNOSIS — M25651 Stiffness of right hip, not elsewhere classified: Secondary | ICD-10-CM | POA: Diagnosis not present

## 2022-12-06 DIAGNOSIS — Z23 Encounter for immunization: Secondary | ICD-10-CM | POA: Diagnosis not present

## 2022-12-06 DIAGNOSIS — R2689 Other abnormalities of gait and mobility: Secondary | ICD-10-CM | POA: Diagnosis not present

## 2022-12-06 DIAGNOSIS — Z96641 Presence of right artificial hip joint: Secondary | ICD-10-CM | POA: Diagnosis not present

## 2022-12-28 DIAGNOSIS — Z471 Aftercare following joint replacement surgery: Secondary | ICD-10-CM | POA: Diagnosis not present

## 2022-12-28 DIAGNOSIS — Z96641 Presence of right artificial hip joint: Secondary | ICD-10-CM | POA: Diagnosis not present

## 2023-03-13 DIAGNOSIS — K2270 Barrett's esophagus without dysplasia: Secondary | ICD-10-CM | POA: Diagnosis not present

## 2023-04-09 DIAGNOSIS — R7302 Impaired glucose tolerance (oral): Secondary | ICD-10-CM | POA: Diagnosis not present

## 2023-04-09 DIAGNOSIS — D500 Iron deficiency anemia secondary to blood loss (chronic): Secondary | ICD-10-CM | POA: Diagnosis not present

## 2023-04-09 DIAGNOSIS — Z0000 Encounter for general adult medical examination without abnormal findings: Secondary | ICD-10-CM | POA: Diagnosis not present

## 2023-04-09 DIAGNOSIS — K2270 Barrett's esophagus without dysplasia: Secondary | ICD-10-CM | POA: Diagnosis not present

## 2023-04-09 DIAGNOSIS — M4723 Other spondylosis with radiculopathy, cervicothoracic region: Secondary | ICD-10-CM | POA: Diagnosis not present

## 2023-04-09 DIAGNOSIS — M4726 Other spondylosis with radiculopathy, lumbar region: Secondary | ICD-10-CM | POA: Diagnosis not present

## 2023-04-09 DIAGNOSIS — Z79899 Other long term (current) drug therapy: Secondary | ICD-10-CM | POA: Diagnosis not present

## 2023-04-09 DIAGNOSIS — E782 Mixed hyperlipidemia: Secondary | ICD-10-CM | POA: Diagnosis not present

## 2023-04-09 DIAGNOSIS — I10 Essential (primary) hypertension: Secondary | ICD-10-CM | POA: Diagnosis not present

## 2023-04-09 DIAGNOSIS — Z125 Encounter for screening for malignant neoplasm of prostate: Secondary | ICD-10-CM | POA: Diagnosis not present

## 2023-05-08 DIAGNOSIS — M25552 Pain in left hip: Secondary | ICD-10-CM | POA: Diagnosis not present

## 2023-06-14 DIAGNOSIS — M1611 Unilateral primary osteoarthritis, right hip: Secondary | ICD-10-CM | POA: Diagnosis not present

## 2023-06-18 DIAGNOSIS — Z6830 Body mass index (BMI) 30.0-30.9, adult: Secondary | ICD-10-CM | POA: Diagnosis not present

## 2023-06-18 DIAGNOSIS — Z01818 Encounter for other preprocedural examination: Secondary | ICD-10-CM | POA: Diagnosis not present

## 2023-07-11 DIAGNOSIS — Z882 Allergy status to sulfonamides status: Secondary | ICD-10-CM | POA: Diagnosis not present

## 2023-07-11 DIAGNOSIS — Z96641 Presence of right artificial hip joint: Secondary | ICD-10-CM | POA: Diagnosis not present

## 2023-07-11 DIAGNOSIS — M1612 Unilateral primary osteoarthritis, left hip: Secondary | ICD-10-CM | POA: Diagnosis not present

## 2023-07-11 DIAGNOSIS — Z96651 Presence of right artificial knee joint: Secondary | ICD-10-CM | POA: Diagnosis not present

## 2023-07-11 DIAGNOSIS — Z79899 Other long term (current) drug therapy: Secondary | ICD-10-CM | POA: Diagnosis not present

## 2023-07-11 DIAGNOSIS — Z87891 Personal history of nicotine dependence: Secondary | ICD-10-CM | POA: Diagnosis not present

## 2023-07-11 DIAGNOSIS — Z881 Allergy status to other antibiotic agents status: Secondary | ICD-10-CM | POA: Diagnosis not present

## 2023-07-11 DIAGNOSIS — Z880 Allergy status to penicillin: Secondary | ICD-10-CM | POA: Diagnosis not present

## 2023-07-11 DIAGNOSIS — Z7982 Long term (current) use of aspirin: Secondary | ICD-10-CM | POA: Diagnosis not present

## 2023-07-26 DIAGNOSIS — Z471 Aftercare following joint replacement surgery: Secondary | ICD-10-CM | POA: Diagnosis not present

## 2023-07-26 DIAGNOSIS — M1612 Unilateral primary osteoarthritis, left hip: Secondary | ICD-10-CM | POA: Diagnosis not present

## 2023-07-26 DIAGNOSIS — Z96642 Presence of left artificial hip joint: Secondary | ICD-10-CM | POA: Diagnosis not present

## 2023-07-26 DIAGNOSIS — Z96641 Presence of right artificial hip joint: Secondary | ICD-10-CM | POA: Diagnosis not present

## 2023-07-26 DIAGNOSIS — M1611 Unilateral primary osteoarthritis, right hip: Secondary | ICD-10-CM | POA: Diagnosis not present

## 2023-08-23 DIAGNOSIS — Z96641 Presence of right artificial hip joint: Secondary | ICD-10-CM | POA: Diagnosis not present

## 2023-08-23 DIAGNOSIS — Z471 Aftercare following joint replacement surgery: Secondary | ICD-10-CM | POA: Diagnosis not present

## 2023-10-03 DIAGNOSIS — I83029 Varicose veins of left lower extremity with ulcer of unspecified site: Secondary | ICD-10-CM | POA: Diagnosis not present

## 2023-10-03 DIAGNOSIS — L03116 Cellulitis of left lower limb: Secondary | ICD-10-CM | POA: Diagnosis not present

## 2023-10-03 DIAGNOSIS — I872 Venous insufficiency (chronic) (peripheral): Secondary | ICD-10-CM | POA: Diagnosis not present

## 2023-10-03 DIAGNOSIS — Z6830 Body mass index (BMI) 30.0-30.9, adult: Secondary | ICD-10-CM | POA: Diagnosis not present

## 2023-10-03 DIAGNOSIS — L97929 Non-pressure chronic ulcer of unspecified part of left lower leg with unspecified severity: Secondary | ICD-10-CM | POA: Diagnosis not present

## 2023-10-03 DIAGNOSIS — G63 Polyneuropathy in diseases classified elsewhere: Secondary | ICD-10-CM | POA: Diagnosis not present

## 2023-10-03 DIAGNOSIS — M4726 Other spondylosis with radiculopathy, lumbar region: Secondary | ICD-10-CM | POA: Diagnosis not present

## 2023-10-29 DIAGNOSIS — Z96641 Presence of right artificial hip joint: Secondary | ICD-10-CM | POA: Diagnosis not present

## 2023-10-29 DIAGNOSIS — M1611 Unilateral primary osteoarthritis, right hip: Secondary | ICD-10-CM | POA: Diagnosis not present

## 2023-10-29 DIAGNOSIS — Z471 Aftercare following joint replacement surgery: Secondary | ICD-10-CM | POA: Diagnosis not present

## 2023-11-13 DIAGNOSIS — Z23 Encounter for immunization: Secondary | ICD-10-CM | POA: Diagnosis not present

## 2023-12-10 DIAGNOSIS — A499 Bacterial infection, unspecified: Secondary | ICD-10-CM | POA: Diagnosis not present

## 2023-12-10 DIAGNOSIS — L723 Sebaceous cyst: Secondary | ICD-10-CM | POA: Diagnosis not present

## 2023-12-10 DIAGNOSIS — Z1331 Encounter for screening for depression: Secondary | ICD-10-CM | POA: Diagnosis not present

## 2023-12-10 DIAGNOSIS — L089 Local infection of the skin and subcutaneous tissue, unspecified: Secondary | ICD-10-CM | POA: Diagnosis not present

## 2023-12-11 IMAGING — MR MR CERVICAL SPINE WO/W CM
7 of 10 series · 27 of 48 positions shown · IV contrast (gadavist)
Comparison: Cervical spine CT from yesterday

CLINICAL DATA: Neck trauma. Left-sided arm and leg weakness after
fall 3 days ago

EXAM:
MRI CERVICAL SPINE WITHOUT AND WITH CONTRAST
TECHNIQUE: Multiplanar and multiecho pulse sequences of the cervical spine, to
include the craniocervical junction and cervicothoracic junction,
were obtained without and with intravenous contrast.
CONTRAST:  8mL GADAVIST GADOBUTROL 1 MMOL/ML IV SOLN

[Series 5: T2 · sagittal · 3.0mm · 0.69mm/px · 2 of 18 slices shown (1 of 3)]
[im 1/18]
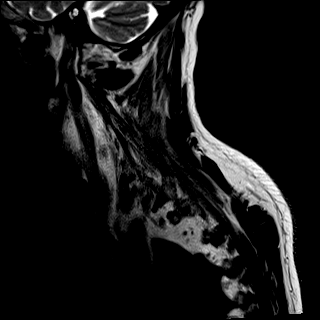
[im 18/18]
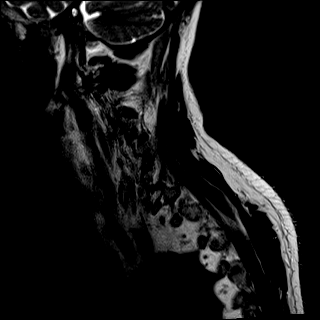

[Series 6: T1 · sagittal · 3.0mm · 0.69mm/px · 2 of 18 slices shown (1 of 2)]
[im 1/18]
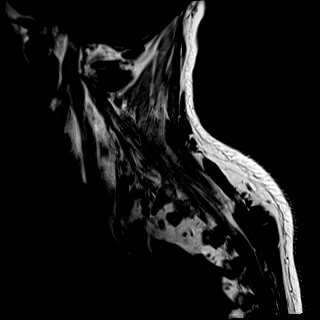
[im 18/18]
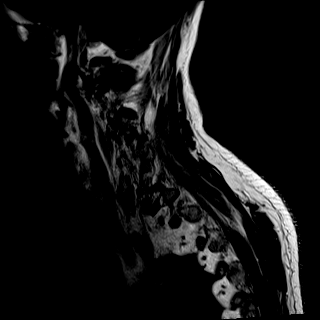

[Series 8: T2 · axial · 3.0mm · 0.62mm/px · z∈[-66,+79]mm · 8 of 47 slices shown (2 of 3)]
[im 1/47]
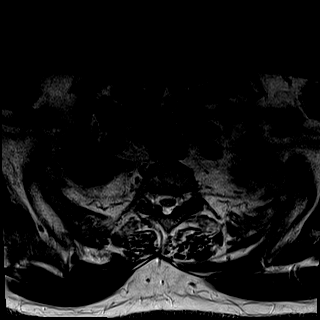
[im 7/47]
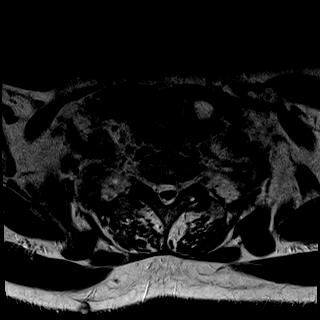
[im 14/47]
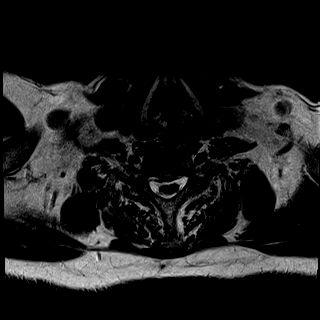
[im 20/47]
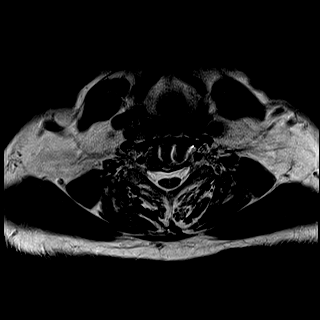
[im 27/47]
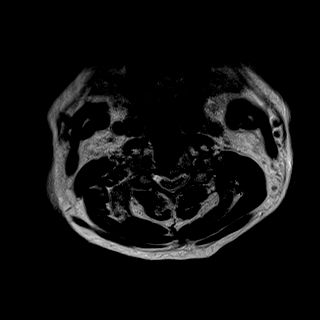
[im 33/47]
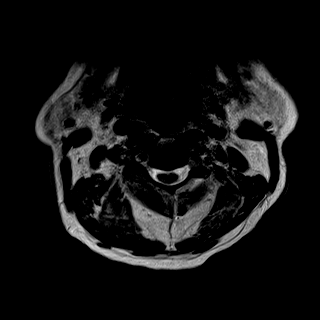
[im 40/47]
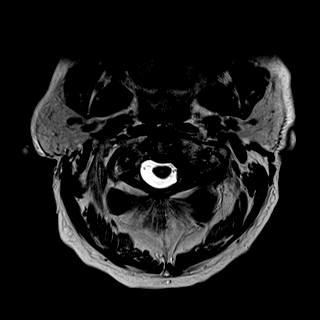
[im 47/47]
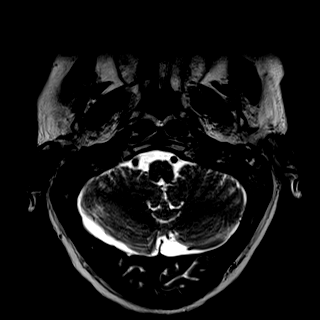

[Series 10: T2 · sagittal · 3.0mm · 0.69mm/px · 3 of 18 slices shown (3 of 3)]
[im 1/18]
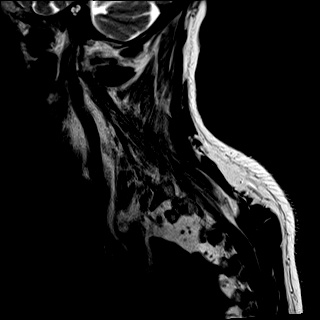
[im 9/18]
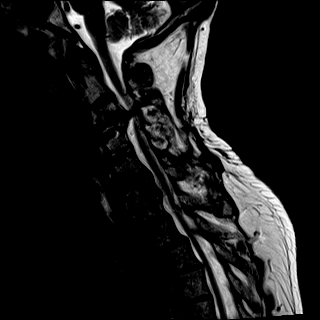
[im 18/18]
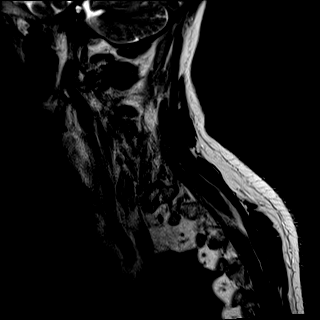

[Series 12: T1 · axial · 3.0mm · 0.39mm/px · z∈[-66,+79]mm · 8 of 47 slices shown (2 of 2)]
[im 1/47]
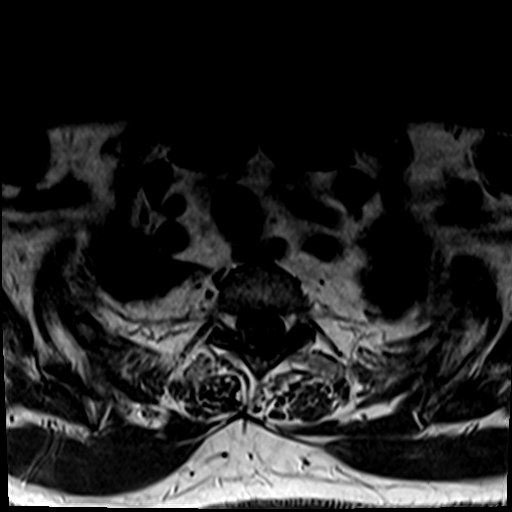
[im 7/47]
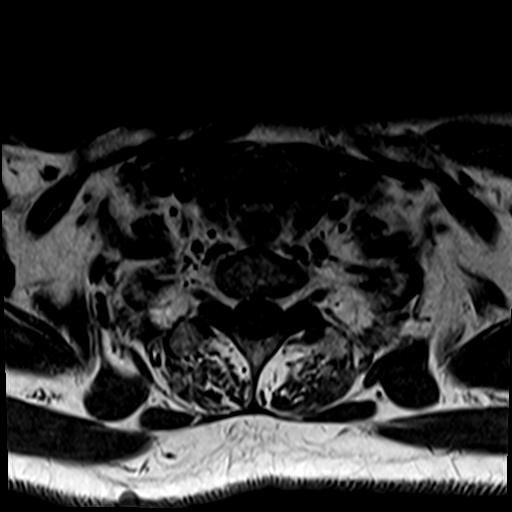
[im 14/47]
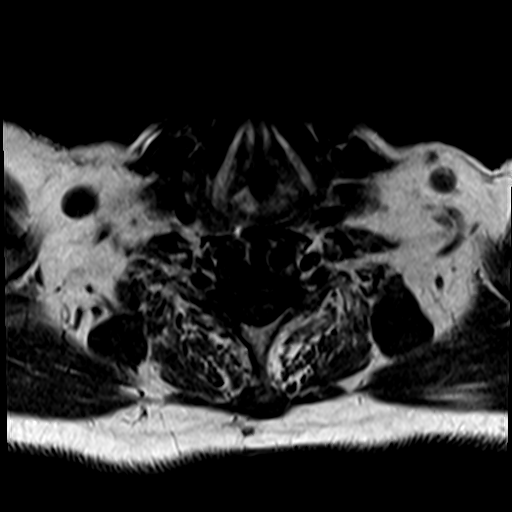
[im 20/47]
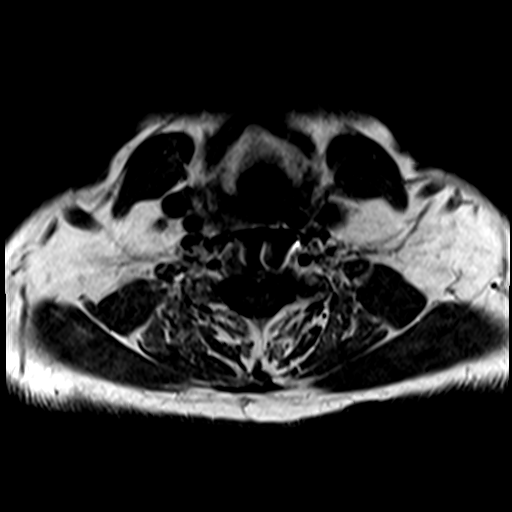
[im 27/47]
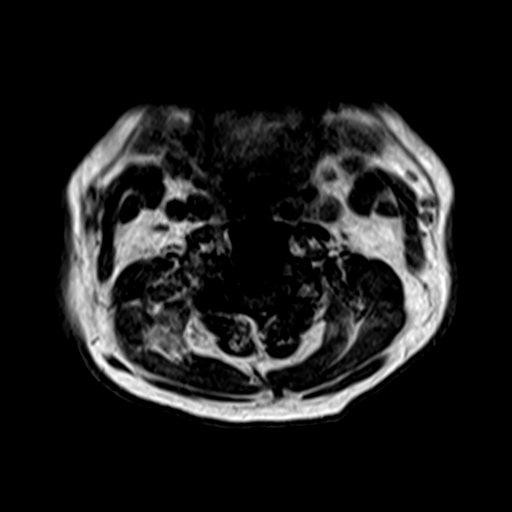
[im 33/47]
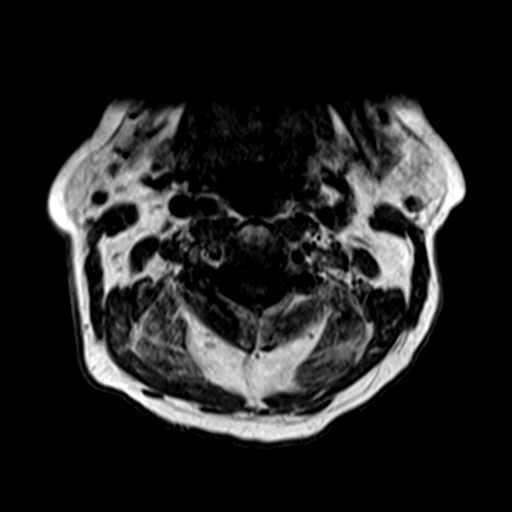
[im 40/47]
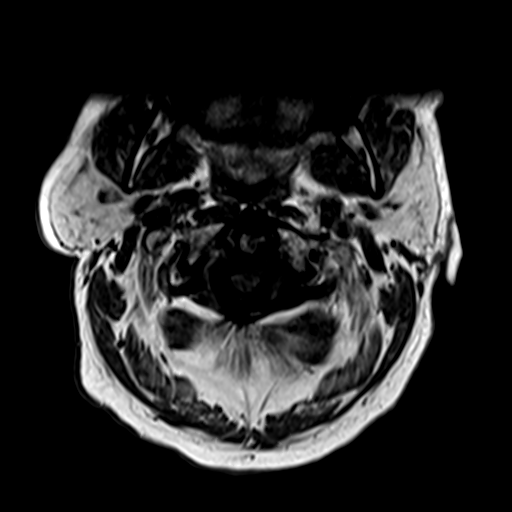
[im 47/47]
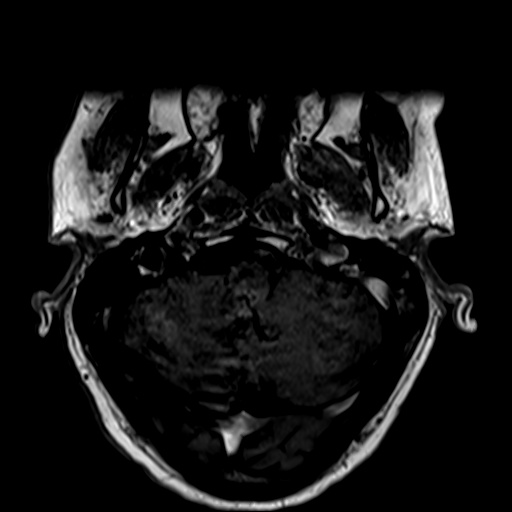

[Series 13: T1 fat-sat post-contrast · sagittal · 3.0mm · 0.43mm/px · 3 of 18 slices shown (1 of 2)]
[im 1/18]
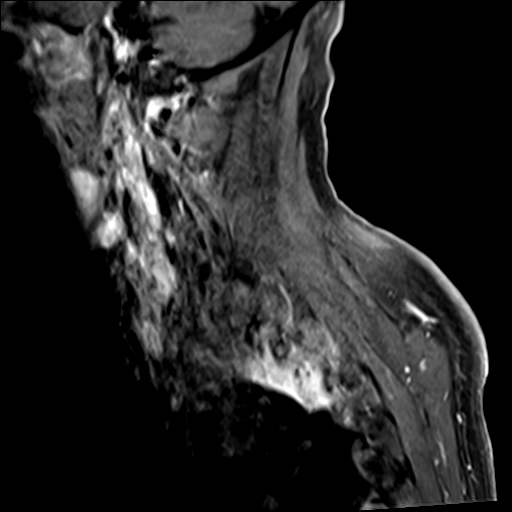
[im 9/18]
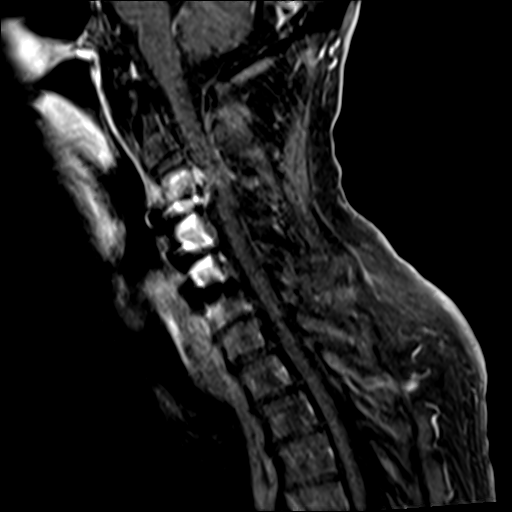
[im 18/18]
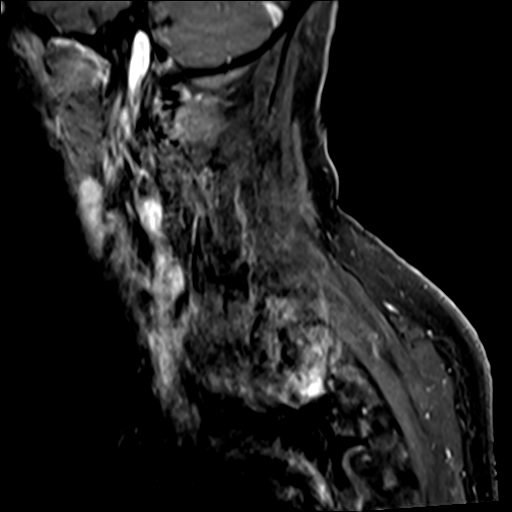

[Series 15: T1 fat-sat post-contrast · sagittal · 3.0mm · 0.43mm/px · 1 of 18 slices shown (2 of 2)]
[im 1/18]
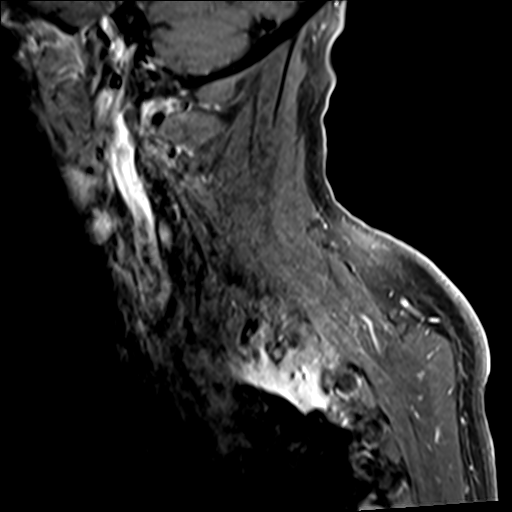

[27 of 48 positions shown; findings below may reference images not displayed]

FINDINGS: Alignment: Degenerative straightening of cervical lordosis.
Degenerative anterolisthesis at T1-2 to T3-4.

Vertebrae: ACDF with solid fusion from C4-C6. Degenerative body and
right facet marrow edema at C3-4. No acute fracture in this area by
cervical spine CT.

Cord: Degenerative cord compression with T2 hyperintensity at C3-4.
[REDACTED] chat message sent to attending physician.

Posterior Fossa, vertebral arteries, paraspinal tissues: No
paravertebral edema or spinal canal hematoma. 12 mm left thyroid
nodule. No followup recommended (ref: [HOSPITAL]. [DATE]): 143-50).

Disc levels:

C1-2: Degenerative facet spurring asymmetric to the left.

C2-3: Degenerative facet spurring asymmetric to the right. Mild
right foraminal narrowing.

C3-4: Disc collapse with central protrusion and endplate spurs
combining with ligamentum flavum thickening to compress the cord.
Moderate bilateral foraminal stenosis at this level, greater on the
right.

C4-5: ACDF with solid arthrodesis

C5-6: ACDF with solid arthrodesis

C6-7: Disc narrowing and bulging with uncovertebral ridging. Mild
right and more moderate left foraminal narrowing. Patent spinal
canal

C7-T1:Disc collapse with bulging and right foraminal protrusion.
Right foraminal impingement.
IMPRESSION: 1. Degenerative cord compression at C3-4 with cord T2
hyperintensity. At the same level there is foraminal impingement and
degenerative body and right facet edema.
2. C4-C6 ACDF with solid arthrodesis.
3. C7-T1 right foraminal impingement.

## 2023-12-12 IMAGING — RF DG CERVICAL SPINE 1V
1 series · 1 of 1 positions shown · non-contrast
Comparison: Cervical MRI, 04/29/2021. Cervical CT, 04/28/2021.
Demonstrates

CLINICAL DATA: Portable image 4 anterior cervical decompression and
discectomy/fusion.

EXAM:
DG CERVICAL SPINE - 1 VIEW

[Series 1: run · 1 of 1 slices shown]
[im 1/1]
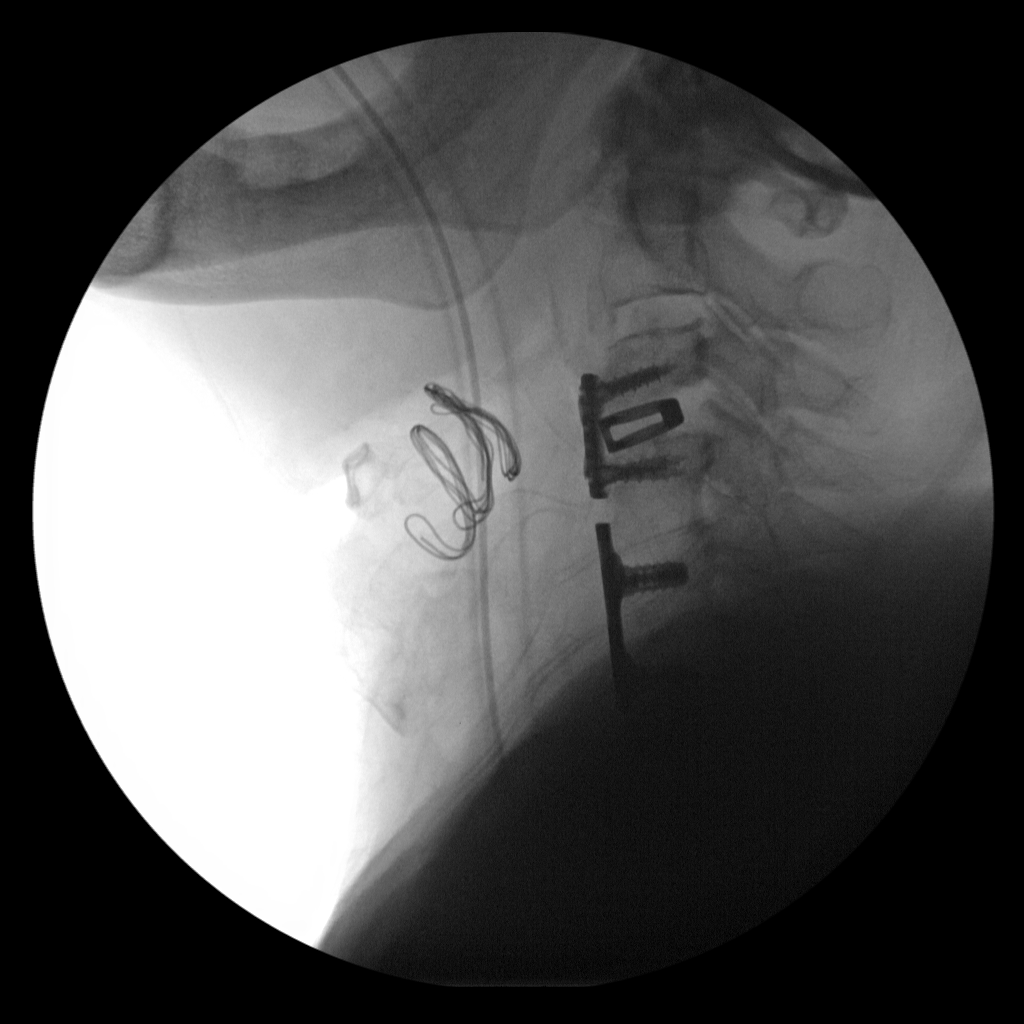

[1 of 1 positions shown; findings below may reference images not displayed]

FINDINGS: Single lateral portable view of the cervical spine an anterior
fusion plate spanning C3-C4, well-seated screws and well-positioned
intervertebral cage/spacer.

There is also anterior fusion plate spanning C5-C6. Mature bone
graft material spans the C4-C5 and C5-C6 disc interspaces.
IMPRESSION: Single lateral cervical spine portable image for anterior cervical
decompression and fusion.

## 2023-12-12 IMAGING — US US RENAL
1 series · 14 of 25 positions shown · non-contrast
Comparison: None.

CLINICAL DATA: Acute kidney injury.

EXAM:
RENAL / URINARY TRACT ULTRASOUND COMPLETE

[Series 1: us renal · 81 acquisitions, 14 frames shown]
[im 1/81]
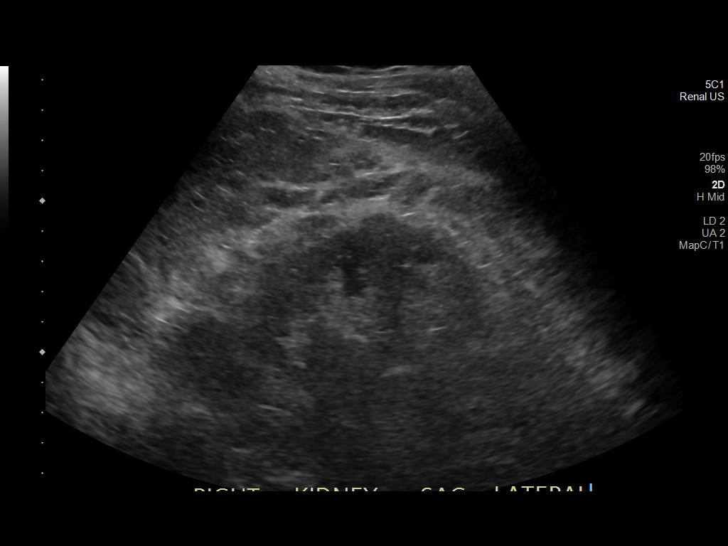
[im 7/81]
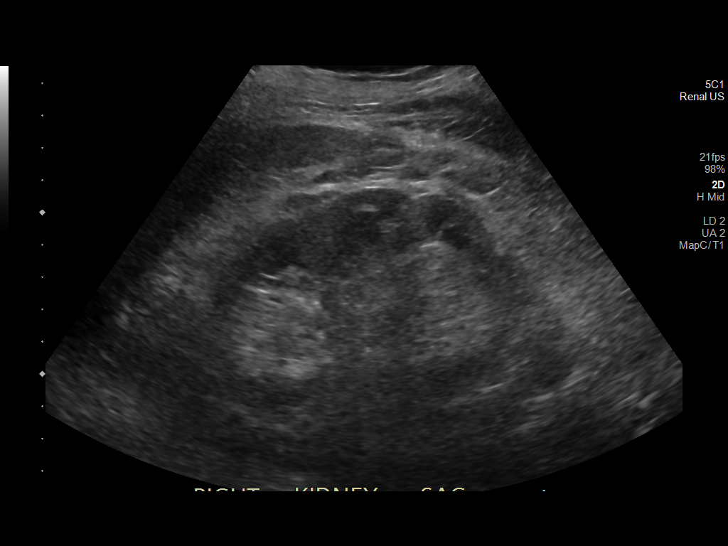
[im 14/81]
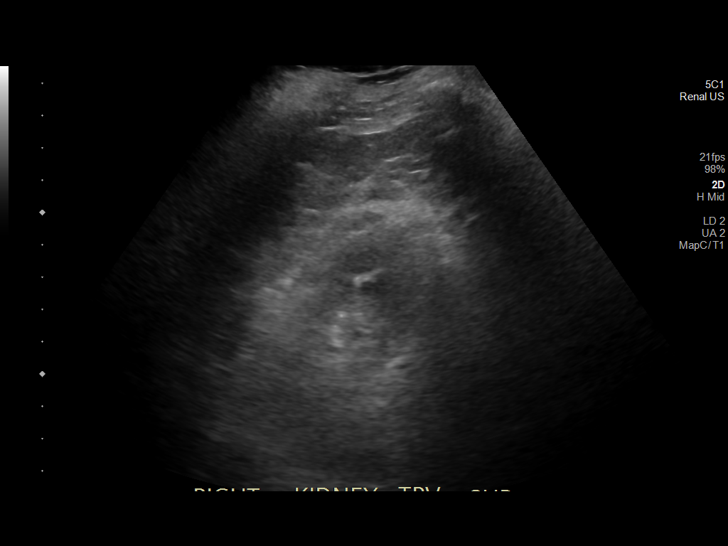
[im 21/81]
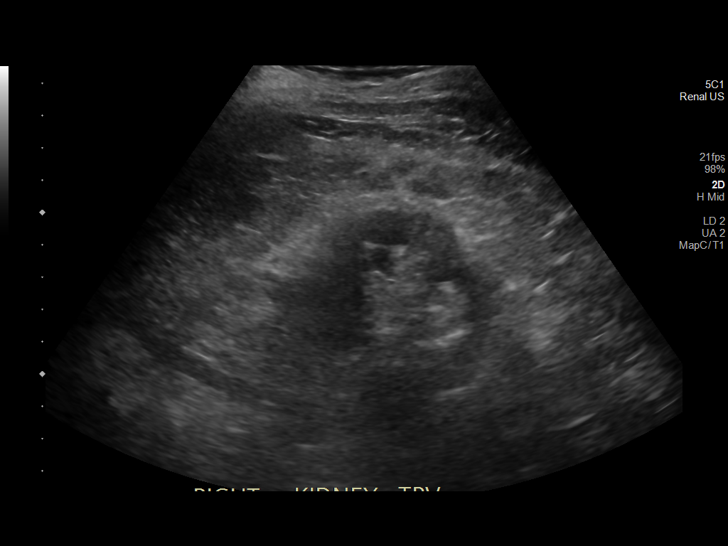
[im 27/81]
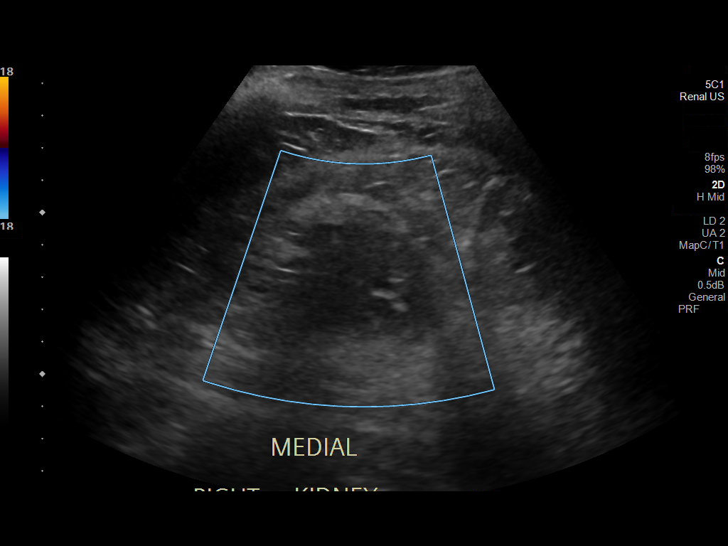
[im 31/81]
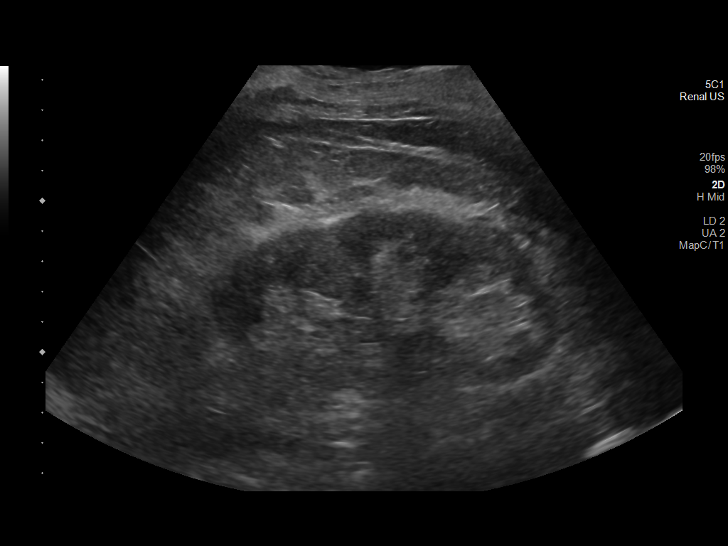
[im 37/81]
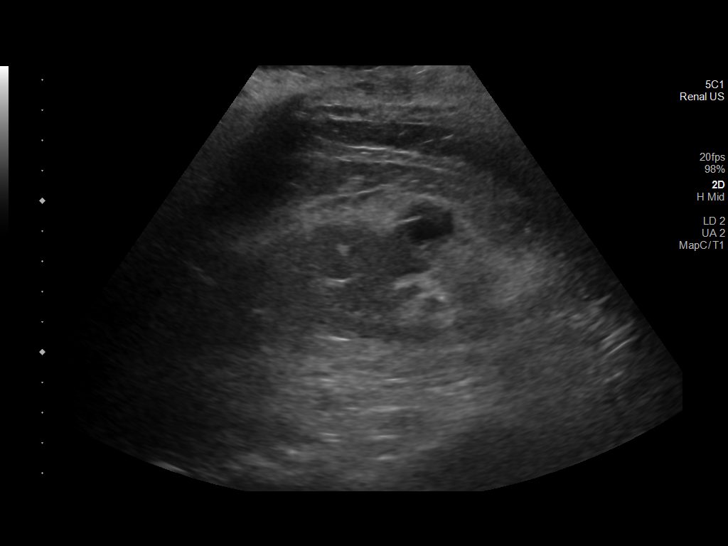
[im 44/81]
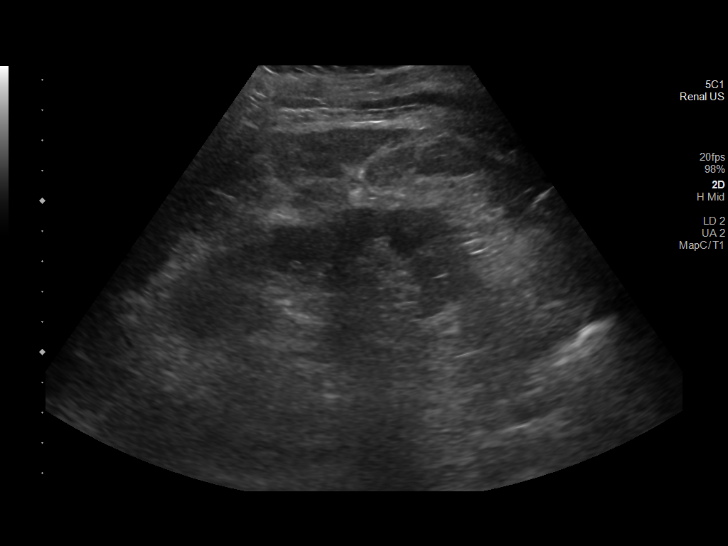
[im 51/81]
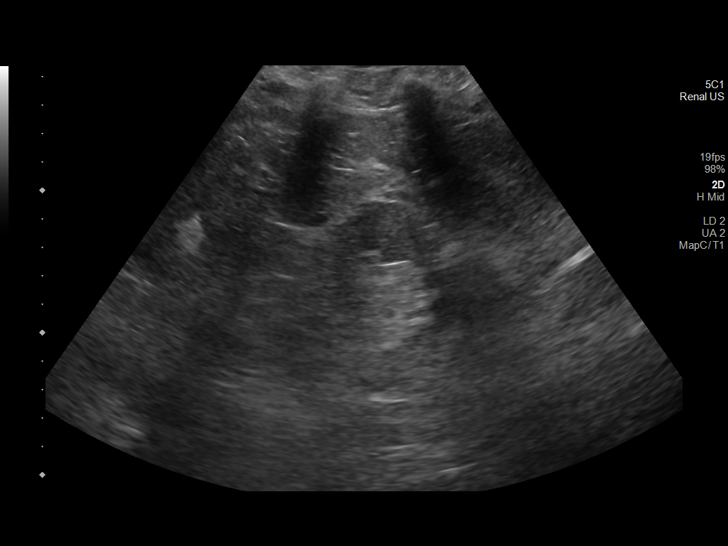
[im 54/81]
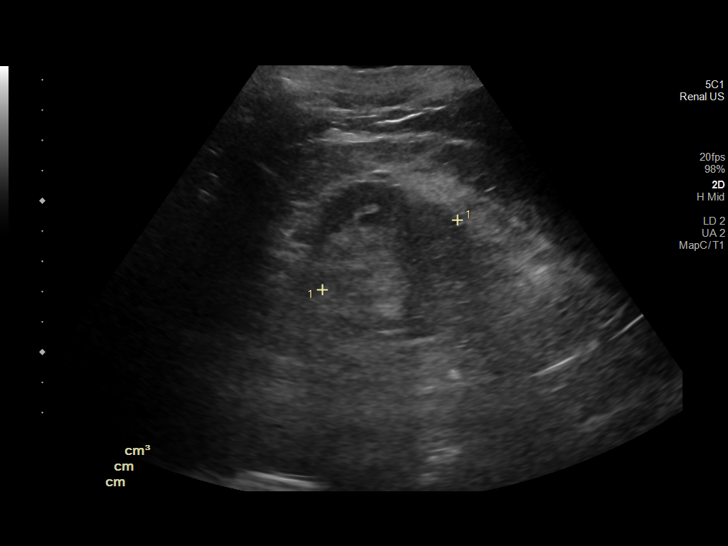
[im 61/81]
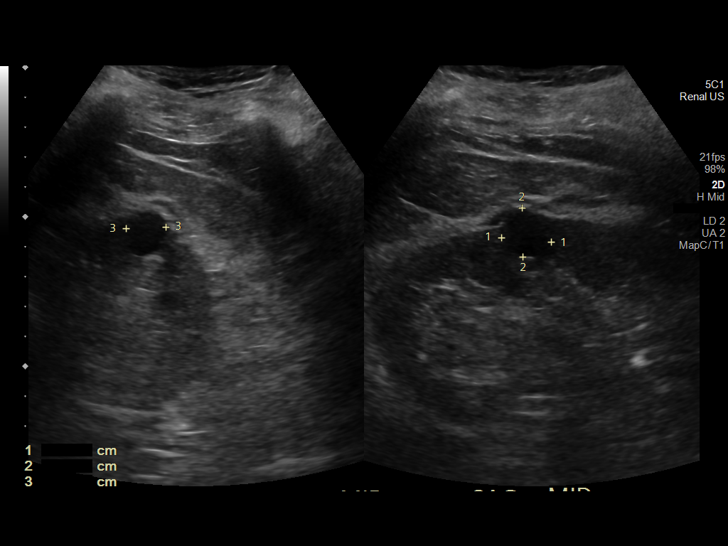
[im 67/81]
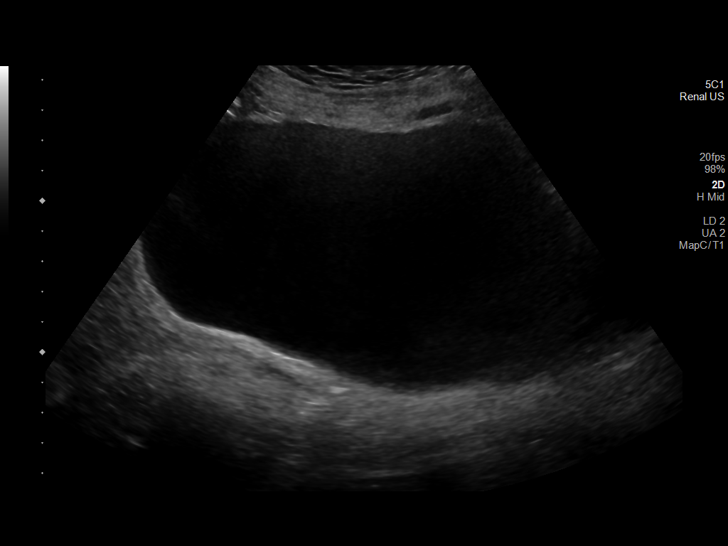
[im 74/81]
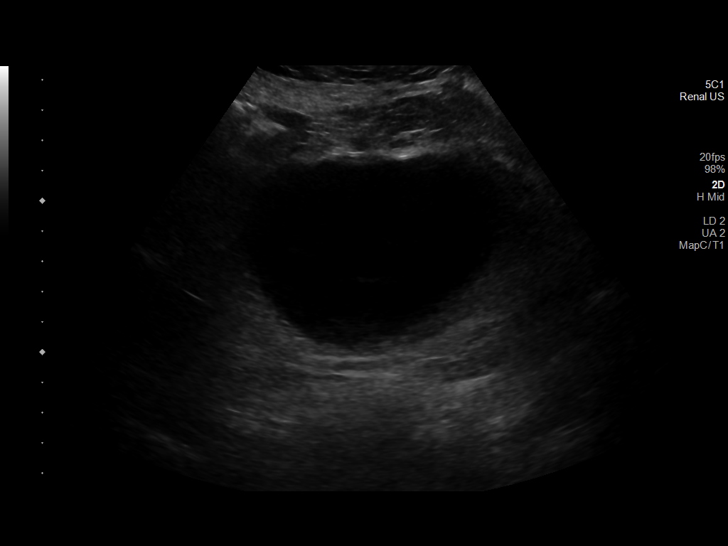
[im 81/81]
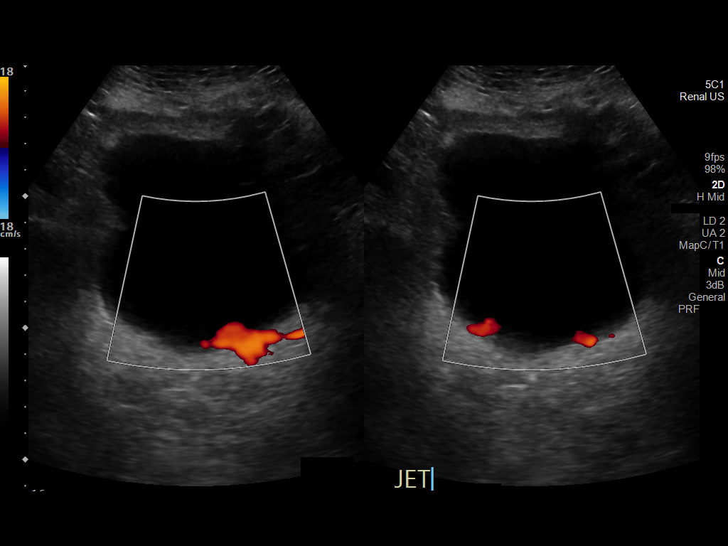

[14 of 25 positions shown; findings below may reference images not displayed]

FINDINGS: Right Kidney:

Renal measurements: 10.3 x 6.4 x 5.8 cm = volume: 200.5 mL. Normal
parenchymal echogenicity. Diffuse cortical thinning. Small scattered
echogenic foci without shadowing, nonspecific. No definite stones.
No hydronephrosis. No masses.

Left Kidney:

Renal measurements: 12.2 x 5.9 x 5.0 cm = volume: 191.1 mL. Normal
parenchymal echogenicity. 2 cysts, largest from the lower pole,
x 2.3 x 2.4 cm, the other cyst arising from the midpole, 1.7 x 1.3 x
1.6 cm. No solid masses. No hydronephrosis.

Bladder:

Appears normal for degree of bladder distention.

Other:

None.
IMPRESSION: 1. No acute findings.  No hydronephrosis.
2. Mild diffuse cortical thinning of the right kidney.
3. Two simple appearing left renal cysts.
